# Patient Record
Sex: Male | Born: 1980
Health system: Southern US, Community
[De-identification: ages and names within clinical notes are randomized; demographics above are authoritative.]

## PROBLEM LIST (undated history)

## (undated) DIAGNOSIS — I10 Essential (primary) hypertension: Secondary | ICD-10-CM

## (undated) HISTORY — DX: Essential (primary) hypertension: I10

---

## 2008-12-09 DIAGNOSIS — J309 Allergic rhinitis, unspecified: Secondary | ICD-10-CM | POA: Insufficient documentation

## 2015-05-04 DIAGNOSIS — H903 Sensorineural hearing loss, bilateral: Secondary | ICD-10-CM | POA: Insufficient documentation

## 2016-09-14 ENCOUNTER — Ambulatory Visit (INDEPENDENT_AMBULATORY_CARE_PROVIDER_SITE_OTHER): Payer: BLUE CROSS/BLUE SHIELD | Admitting: Family Medicine

## 2016-09-14 ENCOUNTER — Encounter: Payer: Self-pay | Admitting: Family Medicine

## 2016-09-14 VITALS — BP 197/117 | HR 80 | Ht 70.0 in | Wt 194.0 lb

## 2016-09-14 DIAGNOSIS — I10 Essential (primary) hypertension: Secondary | ICD-10-CM | POA: Diagnosis not present

## 2016-09-14 DIAGNOSIS — Z789 Other specified health status: Secondary | ICD-10-CM | POA: Diagnosis not present

## 2016-09-14 DIAGNOSIS — Z720 Tobacco use: Secondary | ICD-10-CM | POA: Diagnosis not present

## 2016-09-14 DIAGNOSIS — Z7289 Other problems related to lifestyle: Secondary | ICD-10-CM

## 2016-09-14 DIAGNOSIS — Z114 Encounter for screening for human immunodeficiency virus [HIV]: Secondary | ICD-10-CM

## 2016-09-14 DIAGNOSIS — Z23 Encounter for immunization: Secondary | ICD-10-CM

## 2016-09-14 MED ORDER — LISINOPRIL 20 MG PO TABS: 20.0000 mg | ORAL_TABLET | Freq: Every day | ORAL | 1 refills | Status: DC

## 2016-09-14 NOTE — Progress Notes (Signed)
Lucas Rodriguez is a 35 y.o. male who presents [name] today for establish care and discuss hypertension oral tobacco and alcohol use.   1) hypertension: Patient notes a history of elevated blood pressure in the past. He's been on a diuretic in the past which caused significant urination and was obnoxious. He currently does not take any medications. No chest pain palpitations shortness of breath.  2) oral tobacco: Patient uses oral tobacco daily. He uses dip one can per day. He definitely feels the urge to use when he is not using. He is very much interested in quitting.  3) alcohol: Patient notes increasing alcohol use over the last several years. 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 However he is drinking almost every day now more than he thinks he should. He denies any alcohol withdrawal with quitting. He does not a family history for alcoholism.   Past Medical History:  Diagnosis Date  . Hypertension    History reviewed. No pertinent surgical history. Social History  Substance Use Topics  . Smoking status: Never Smoker  . Smokeless tobacco: Current User    Types: Snuff  . Alcohol use Yes   family history includes Alcohol abuse in his maternal grandfather, maternal grandmother, paternal grandfather, and paternal grandmother; Cancer in his maternal grandfather, maternal grandmother, paternal grandfather, and paternal grandmother; Hyperlipidemia in his father, maternal grandfather, mother, and paternal grandmother; Hypertension in his father, maternal grandfather, mother, and paternal grandmother.  ROS as above: No headache, visual changes, nausea, vomiting, diarrhea, constipation, dizziness, abdominal pain, skin rash, fevers, chills, night sweats, weight loss, swollen lymph nodes, body aches, joint swelling,  muscle aches, chest pain, shortness of breath, mood changes, visual or auditory hallucinations.    Medications: Current Outpatient Prescriptions  Medication Sig Dispense Refill  . lisinopril (PRINIVIL,ZESTRIL) 20 MG tablet Take 1 tablet (20 mg total) by mouth daily. 30 tablet 1   No current facility-administered medications for this visit.    Allergies  Allergen Reactions  . Penicillin G Rash    Also itching     Exam:  BP (!) 197/117   Pulse 80   Ht 5\' 10"  (1.778 m)   Wt 194 lb (88 kg)   BMI 27.84 kg/m  Gen: Well NAD HEENT: EOMI,  MMM Lungs: Normal work of breathing. CTABL Heart: RRR no MRG Abd: NABS, Soft. Nondistended, Nontender Exts: Brisk capillary refill, warm and well perfused.   Psych: Alert and oriented process and affect.  No results found for this or any previous visit (from the past 24 hour(s)). No results found.    Assessment and Plan: 35 y.o. male with   Hypertension: Not well controlled. Start lisinopril and check basic fasting labs  Oral tobacco: We spent a great deal of time talking about treatment options. Plan for nicotine replacement over-the-counter and recheck in 1 month.  Alcohol use: Verging on dependency or alcohol use disorder: Plan to work on cutting back or abstaining. Recheck in one month.   Orders Placed This Encounter  Procedures  . Tdap vaccine greater than or equal to 7yo IM  . Flu Vaccine QUAD 36+ mos PF IM (Fluarix & Fluzone Quad PF)  . CBC  . COMPLETE METABOLIC PANEL WITH GFR  . Hemoglobin A1c  . Lipid panel  . HIV antibody  . TSH  . VITAMIN D 25 Hydroxy (Vit-D Deficiency, Fractures)  Discussed warning signs or symptoms. Please see discharge instructions. Patient expresses understanding.

## 2016-09-14 NOTE — Patient Instructions (Signed)
Thank you for coming in today. 1) Blood pressure: Start Lisinopril daily.  Get fasting labs soon.  Recheck in 1 month.   2) Tobacco: Work on quitting. You can use a patch or gum or lozenges.   3) Alcohol: I recommend not drinking most nights.   We will recheck in 1 month.   Lisinopril tablets What is this medicine? LISINOPRIL (lyse IN oh pril) is an ACE inhibitor. This medicine is used to treat high blood pressure and heart failure. It is also used to protect the heart immediately after a heart attack. This medicine may be used for other purposes; ask your health care provider or pharmacist if you have questions. What should I tell my health care provider before I take this medicine? They need to know if you have any of these conditions: -diabetes -heart or blood vessel disease -kidney disease -low blood pressure -previous swelling of the tongue, face, or lips with difficulty breathing, difficulty swallowing, hoarseness, or tightening of the throat -an unusual or allergic reaction to lisinopril, other ACE inhibitors, insect venom, foods, dyes, or preservatives -pregnant or trying to get pregnant -breast-feeding How should I use this medicine? Take this medicine by mouth with a glass of water. Follow the directions on your prescription label. You may take this medicine with or without food. If it upsets your stomach, take it with food. Take your medicine at regular intervals. Do not take it more often than directed. Do not stop taking except on your doctor's advice. Talk to your pediatrician regarding the use of this medicine in children. Special care may be needed. While this drug may be prescribed for children as young as 256 years of age for selected conditions, precautions do apply. Overdosage: If you think you have taken too much of this medicine contact a poison control center or emergency room at once. NOTE: This medicine is only for you. Do not share this medicine with others. What  if I miss a dose? If you miss a dose, take it as soon as you can. If it is almost time for your next dose, take only that dose. Do not take double or extra doses. What may interact with this medicine? Do not take this medicine with any of the following medications: -hymenoptera venomThis medicines may also interact with the following medications: -aliskiren -angiotensin receptor blockers, like losartan or valsartan -certain medicines for diabetes -diuretics -everolimus -gold compounds -lithium -NSAIDs, medicines for pain and inflammation, like ibuprofen or naproxen -potassium salts or supplements -salt substitutes -sirolimus -temsirolimus This list may not describe all possible interactions. Give your health care provider a list of all the medicines, herbs, non-prescription drugs, or dietary supplements you use. Also tell them if you smoke, drink alcohol, or use illegal drugs. Some items may interact with your medicine. What should I watch for while using this medicine? Visit your doctor or health care professional for regular check ups. Check your blood pressure as directed. Ask your doctor what your blood pressure should be, and when you should contact him or her. Do not treat yourself for coughs, colds, or pain while you are using this medicine without asking your doctor or health care professional for advice. Some ingredients may increase your blood pressure. Women should inform their doctor if they wish to become pregnant or think they might be pregnant. There is a potential for serious side effects to an unborn child. Talk to your health care professional or pharmacist for more information. Check with your doctor or health  care professional if you get an attack of severe diarrhea, nausea and vomiting, or if you sweat a lot. The loss of too much body fluid can make it dangerous for you to take this medicine. You may get drowsy or dizzy. Do not drive, use machinery, or do anything that  needs mental alertness until you know how this drug affects you. Do not stand or sit up quickly, especially if you are an older patient. This reduces the risk of dizzy or fainting spells. Alcohol can make you more drowsy and dizzy. Avoid alcoholic drinks. Avoid salt substitutes unless you are told otherwise by your doctor or health care professional. What side effects may I notice from receiving this medicine? Side effects that you should report to your doctor or health care professional as soon as possible: -allergic reactions like skin rash, itching or hives, swelling of the hands, feet, face, lips, throat, or tongue -breathing problems -signs and symptoms of kidney injury like trouble passing urine or change in the amount of urine -signs and symptoms of increased potassium like muscle weakness; chest pain; or fast, irregular heartbeat -signs and symptoms of liver injury like dark yellow or brown urine; general ill feeling or flu-like symptoms; light-colored stools; loss of appetite; nausea; right upper belly pain; unusually weak or tired; yellowing of the eyes or skin -signs and symptoms of low blood pressure like dizziness; feeling faint or lightheaded, falls; unusually weak or tired -stomach pain with or without nausea and vomiting Side effects that usually do not require medical attention (report to your doctor or health care professional if they continue or are bothersome): -changes in taste -cough -dizziness -fever -headache -sensitivity to light This list may not describe all possible side effects. Call your doctor for medical advice about side effects. You may report side effects to FDA at 1-800-FDA-1088. Where should I keep my medicine? Keep out of the reach of children. Store at room temperature between 15 and 30 degrees C (59 and 86 degrees F). Protect from moisture. Keep container tightly closed. Throw away any unused medicine after the expiration date. NOTE: This sheet is a  summary. It may not cover all possible information. If you have questions about this medicine, talk to your doctor, pharmacist, or health care provider.    2016, Elsevier/Gold Standard. (2015-07-23 20:38:20)

## 2016-09-15 LAB — COMPLETE METABOLIC PANEL WITH GFR
ALBUMIN: 4.7 g/dL (ref 3.6–5.1)
ALK PHOS: 75 U/L (ref 40–115)
ALT: 54 U/L — ABNORMAL HIGH (ref 9–46)
AST: 40 U/L (ref 10–40)
BUN: 12 mg/dL (ref 7–25)
CALCIUM: 9.5 mg/dL (ref 8.6–10.3)
CO2: 25 mmol/L (ref 20–31)
Chloride: 101 mmol/L (ref 98–110)
Creat: 1.02 mg/dL (ref 0.60–1.35)
Glucose, Bld: 87 mg/dL (ref 65–99)
POTASSIUM: 4 mmol/L (ref 3.5–5.3)
Sodium: 139 mmol/L (ref 135–146)
Total Bilirubin: 1 mg/dL (ref 0.2–1.2)
Total Protein: 6.7 g/dL (ref 6.1–8.1)

## 2016-09-15 LAB — CBC
HEMATOCRIT: 48.6 % (ref 38.5–50.0)
HEMOGLOBIN: 17 g/dL (ref 13.2–17.1)
MCH: 32.8 pg (ref 27.0–33.0)
MCHC: 35 g/dL (ref 32.0–36.0)
MCV: 93.6 fL (ref 80.0–100.0)
MPV: 9 fL (ref 7.5–12.5)
Platelets: 225 10*3/uL (ref 140–400)
RBC: 5.19 MIL/uL (ref 4.20–5.80)
RDW: 12.8 % (ref 11.0–15.0)
WBC: 7.2 10*3/uL (ref 3.8–10.8)

## 2016-09-15 LAB — LIPID PANEL
CHOL/HDL RATIO: 4.5 ratio (ref <=5.0)
CHOLESTEROL: 265 mg/dL — AB (ref 125–200)
HDL: 59 mg/dL (ref >=40)
LDL Cholesterol: 145 mg/dL — ABNORMAL HIGH (ref <130)
TRIGLYCERIDES: 306 mg/dL — AB (ref <150)
VLDL: 61 mg/dL — ABNORMAL HIGH (ref <30)

## 2016-09-15 LAB — TSH: TSH: 1.29 m[IU]/L (ref 0.40–4.50)

## 2016-09-16 LAB — HEMOGLOBIN A1C
HEMOGLOBIN A1C: 5 % (ref <5.7)
Mean Plasma Glucose: 97 mg/dL

## 2016-09-16 LAB — VITAMIN D 25 HYDROXY (VIT D DEFICIENCY, FRACTURES): VIT D 25 HYDROXY: 23 ng/mL — AB (ref 30–100)

## 2016-09-16 LAB — HIV ANTIBODY (ROUTINE TESTING W REFLEX): HIV 1&2 Ab, 4th Generation: NONREACTIVE

## 2016-10-14 ENCOUNTER — Other Ambulatory Visit: Payer: Self-pay | Admitting: *Deleted

## 2016-10-14 DIAGNOSIS — I10 Essential (primary) hypertension: Secondary | ICD-10-CM

## 2016-10-14 DIAGNOSIS — Z720 Tobacco use: Secondary | ICD-10-CM

## 2016-10-14 DIAGNOSIS — Z7289 Other problems related to lifestyle: Secondary | ICD-10-CM

## 2016-10-14 DIAGNOSIS — Z789 Other specified health status: Secondary | ICD-10-CM

## 2016-10-14 NOTE — Progress Notes (Signed)
Request for 90 day supply for lisinopril 20. We can refill  For 90 after patoen has f/u appt. Should have meds until then. rx was originally written 10/2 for #30 with one refill

## 2016-10-17 ENCOUNTER — Ambulatory Visit: Payer: BLUE CROSS/BLUE SHIELD | Admitting: Family Medicine

## 2016-10-28 ENCOUNTER — Encounter: Payer: Self-pay | Admitting: Family Medicine

## 2016-10-28 ENCOUNTER — Ambulatory Visit (INDEPENDENT_AMBULATORY_CARE_PROVIDER_SITE_OTHER): Payer: BLUE CROSS/BLUE SHIELD | Admitting: Family Medicine

## 2016-10-28 VITALS — BP 142/92 | HR 84 | Wt 197.0 lb

## 2016-10-28 DIAGNOSIS — I10 Essential (primary) hypertension: Secondary | ICD-10-CM

## 2016-10-28 DIAGNOSIS — Z720 Tobacco use: Secondary | ICD-10-CM

## 2016-10-28 DIAGNOSIS — Z789 Other specified health status: Secondary | ICD-10-CM | POA: Diagnosis not present

## 2016-10-28 DIAGNOSIS — Z7289 Other problems related to lifestyle: Secondary | ICD-10-CM

## 2016-10-28 MED ORDER — LISINOPRIL-HYDROCHLOROTHIAZIDE 20-25 MG PO TABS: 1.0000 | ORAL_TABLET | Freq: Every day | ORAL | 0 refills | Status: DC

## 2016-10-28 NOTE — Progress Notes (Signed)
       Sunnie NielsenDrew Hegna is a 35 y.o. male who [name] today for follow-up blood pressure tobacco and alcohol.  Hypertension: Doing well with lisinopril 20 mg daily. No chest pains palpitations shortness of breath lightheadedness or dizziness.  Oral tobacco: Decreased use from 7 tins of dip weekly to 1.5 tins of dip weekly.   Alcohol: Patient is reduced his drinking to just on the weekends. He feels well.   Past Medical History:  Diagnosis Date  . Hypertension    No past surgical history on file. Social History  Substance Use Topics  . Smoking status: Never Smoker  . Smokeless tobacco: Current User    Types: Snuff  . Alcohol use Yes   family history includes Alcohol abuse in his maternal grandfather, maternal grandmother, paternal grandfather, and paternal grandmother; Cancer in his maternal grandfather, maternal grandmother, paternal grandfather, and paternal grandmother; Hyperlipidemia in his father, maternal grandfather, mother, and paternal grandmother; Hypertension in his father, maternal grandfather, mother, and paternal grandmother.  ROS as above:  Medications: Current Outpatient Prescriptions  Medication Sig Dispense Refill  . lisinopril-hydrochlorothiazide (PRINZIDE,ZESTORETIC) 20-25 MG tablet Take 1 tablet by mouth daily. 90 tablet 0   No current facility-administered medications for this visit.    Allergies  Allergen Reactions  . Penicillin G Rash    Also itching    Health Maintenance Health Maintenance  Topic Date Due  . TETANUS/TDAP  09/14/2026  . INFLUENZA VACCINE  Completed  . HIV Screening  Completed     Exam:  BP (!) 142/92   Pulse 84   Wt 197 lb (89.4 kg)   BMI 28.27 kg/m  Gen: Well NAD HEENT: EOMI,  MMM Lungs: Normal work of breathing. CTABL Heart: RRR no MRG Abd: NABS, Soft. Nondistended, Nontender Exts: Brisk  capillary refill, warm and well perfused.    No results found for this or any previous visit (from the past 72 hour(s)). No results found.    Assessment and Plan: 35 y.o. male with  Hypertension: Doing well. Increase to lisinopril 20 mg/hydrochlorothiazide 25 mg daily. Recheck in 1 month. Check labs at that time.  Tobacco and alcohol successfully decreased. Recheck in the near future.   Orders Placed This Encounter  Procedures  . COMPLETE METABOLIC PANEL WITH GFR    Discussed warning signs or symptoms. Please see discharge instructions. Patient expresses understanding.

## 2016-10-28 NOTE — Patient Instructions (Signed)
Thank you for coming in today. STOP lisinopril START lisinopril/HCTZ.  Return for recheck in about 1 month.  Get lab a few days before your recheck.  Call or go to the emergency room if you get worse, have trouble breathing, have chest pains, or palpitations.   Hydrochlorothiazide, HCTZ; Lisinopril tablets What is this medicine? HYDROCHLOROTHIAZIDE; LISINOPRIL (hye droe klor oh THYE a zide; lyse IN oh pril) is a combination of a diuretic and an ACE inhibitor. It is used to treat high blood pressure. This medicine may be used for other purposes; ask your health care provider or pharmacist if you have questions. COMMON BRAND NAME(S): Prinzide, Zestoretic What should I tell my health care provider before I take this medicine? They need to know if you have any of these conditions: -bone marrow disease -decreased urine -heart or blood vessel disease -if you are on a special diet like a low salt diet -immune system problems, like lupus -kidney disease -liver disease -previous swelling of the tongue, face, or lips with difficulty breathing, difficulty swallowing, hoarseness, or tightening of the throat -recent heart attack or stroke -an unusual or allergic reaction to lisinopril, hydrochlorothiazide, sulfa drugs, other medicines, insect venom, foods, dyes, or preservatives -pregnant or trying to get pregnant -breast-feeding How should I use this medicine? Take this medicine by mouth with a glass of water. Follow the directions on the prescription label. You can take it with or without food. If it upsets your stomach, take it with food. Take your medicine at regular intervals. Do not take it more often than directed. Do not stop taking except on your doctor's advice. Talk to your pediatrician regarding the use of this medicine in children. Special care may be needed. Overdosage: If you think you have taken too much of this medicine contact a poison control center or emergency room at  once. NOTE: This medicine is only for you. Do not share this medicine with others. What if I miss a dose? If you miss a dose, take it as soon as you can. If it is almost time for your next dose, take only that dose. Do not take double or extra doses. What may interact with this medicine? Do not take this medication with any of the following medications: -sacubitril; valsartan This medicine may also interact with the following: -barbiturates like phenobarbital -blood pressure medicines -corticosteroids like prednisone -diabetic medications -diuretics, especially triamterene, spironolactone or amiloride -lithium -NSAIDs, medicines for pain and inflammation, like ibuprofen or naproxen -potassium salts or potassium supplements -prescription pain medicines -skeletal muscle relaxants like tubocurarine -some cholesterol lowering medications like cholestyramine or colestipol This list may not describe all possible interactions. Give your health care provider a list of all the medicines, herbs, non-prescription drugs, or dietary supplements you use. Also tell them if you smoke, drink alcohol, or use illegal drugs. Some items may interact with your medicine. What should I watch for while using this medicine? Visit your doctor or health care professional for regular checks on your progress. Check your blood pressure as directed. Ask your doctor or health care professional what your blood pressure should be and when you should contact him or her. Call your doctor or health care professional if you notice an irregular or fast heart beat. You must not get dehydrated. Ask your doctor or health care professional how much fluid you need to drink a day. Check with him or her if you get an attack of severe diarrhea, nausea and vomiting, or if you sweat a  lot. The loss of too much body fluid can make it dangerous for you to take this medicine. Women should inform their doctor if they wish to become pregnant or  think they might be pregnant. There is a potential for serious side effects to an unborn child. Talk to your health care professional or pharmacist for more information. You may get drowsy or dizzy. Do not drive, use machinery, or do anything that needs mental alertness until you know how this drug affects you. Do not stand or sit up quickly, especially if you are an older patient. This reduces the risk of dizzy or fainting spells. Alcohol can make you more drowsy and dizzy. Avoid alcoholic drinks. This medicine may affect your blood sugar level. If you have diabetes, check with your doctor or health care professional before changing the dose of your diabetic medicine. Avoid salt substitutes unless you are told otherwise by your doctor or health care professional. This medicine can make you more sensitive to the sun. Keep out of the sun. If you cannot avoid being in the sun, wear protective clothing and use sunscreen. Do not use sun lamps or tanning beds/booths. Do not treat yourself for coughs, colds, or pain while you are taking this medicine without asking your doctor or health care professional for advice. Some ingredients may increase your blood pressure. What side effects may I notice from receiving this medicine? Side effects that you should report to your doctor or health care professional as soon as possible: -changes in vision -confusion, dizziness, light headedness or fainting spells -decreased amount of urine passed -difficulty breathing or swallowing, hoarseness, or tightening of the throat -eye pain -fast or irregular heart beat, palpitations, or chest pain -muscle cramps -nausea and vomiting -persistent dry cough -redness, blistering, peeling or loosening of the skin, including inside the mouth -stomach pain -swelling of your face, lips, tongue, hands, or feet -unusual rash, bleeding or bruising, or pinpoint red spots on the skin -worsened gout pain -yellowing of the eyes or  skin Side effects that usually do not require medical attention (report to your doctor or health care professional if they continue or are bothersome): -change in sex drive or performance -cough -headache This list may not describe all possible side effects. Call your doctor for medical advice about side effects. You may report side effects to FDA at 1-800-FDA-1088. Where should I keep my medicine? Keep out of the reach of children. Store at room temperature between 20 and 25 degrees C (68 and 77 degrees F). Protect from moisture and excessive light. Keep container tightly closed. Throw away any unused medicine after the expiration date. NOTE: This sheet is a summary. It may not cover all possible information. If you have questions about this medicine, talk to your doctor, pharmacist, or health care provider.  2017 Elsevier/Gold Standard (2016-01-22 11:42:20)

## 2016-11-19 LAB — COMPLETE METABOLIC PANEL WITH GFR
ALBUMIN: 4.7 g/dL (ref 3.6–5.1)
ALK PHOS: 63 U/L (ref 40–115)
ALT: 69 U/L — ABNORMAL HIGH (ref 9–46)
AST: 40 U/L (ref 10–40)
BUN: 10 mg/dL (ref 7–25)
CHLORIDE: 101 mmol/L (ref 98–110)
CO2: 25 mmol/L (ref 20–31)
Calcium: 9.5 mg/dL (ref 8.6–10.3)
Creat: 1.1 mg/dL (ref 0.60–1.35)
GFR, EST NON AFRICAN AMERICAN: 88 mL/min (ref >=60)
GFR, Est African American: >89 mL/min (ref >=60)
GLUCOSE: 108 mg/dL — AB (ref 65–99)
POTASSIUM: 3.6 mmol/L (ref 3.5–5.3)
SODIUM: 140 mmol/L (ref 135–146)
Total Bilirubin: 0.7 mg/dL (ref 0.2–1.2)
Total Protein: 6.9 g/dL (ref 6.1–8.1)

## 2016-11-25 ENCOUNTER — Ambulatory Visit (INDEPENDENT_AMBULATORY_CARE_PROVIDER_SITE_OTHER): Payer: BLUE CROSS/BLUE SHIELD | Admitting: Family Medicine

## 2016-11-25 VITALS — BP 131/77 | HR 93 | Wt 199.0 lb

## 2016-11-25 DIAGNOSIS — Z23 Encounter for immunization: Secondary | ICD-10-CM | POA: Diagnosis not present

## 2016-11-25 DIAGNOSIS — Z7184 Encounter for health counseling related to travel: Secondary | ICD-10-CM

## 2016-11-25 DIAGNOSIS — Z789 Other specified health status: Secondary | ICD-10-CM | POA: Diagnosis not present

## 2016-11-25 DIAGNOSIS — Z7189 Other specified counseling: Secondary | ICD-10-CM

## 2016-11-25 DIAGNOSIS — Z720 Tobacco use: Secondary | ICD-10-CM

## 2016-11-25 DIAGNOSIS — I10 Essential (primary) hypertension: Secondary | ICD-10-CM | POA: Diagnosis not present

## 2016-11-25 DIAGNOSIS — Z7289 Other problems related to lifestyle: Secondary | ICD-10-CM

## 2016-11-25 MED ORDER — LISINOPRIL-HYDROCHLOROTHIAZIDE 20-25 MG PO TABS: 1.0000 | ORAL_TABLET | Freq: Every day | ORAL | 0 refills | Status: DC

## 2016-11-25 MED ORDER — CIPROFLOXACIN HCL 500 MG PO TABS: 500.0000 mg | ORAL_TABLET | Freq: Two times a day (BID) | ORAL | 0 refills | Status: AC

## 2016-11-25 MED ORDER — AMLODIPINE BESYLATE 10 MG PO TABS: 10.0000 mg | ORAL_TABLET | Freq: Every day | ORAL | 0 refills | Status: DC

## 2016-11-25 NOTE — Patient Instructions (Addendum)
Thank you for coming in today. Consider the [facility] [place] for alcohol abuse.  Also consider AA.  Take Norvasc daily for blood pressure.   Recheck in 2-3 months.

## 2016-11-25 NOTE — Progress Notes (Signed)
Pt given 1st hep A vaccination today. He will need to RTC in 6 mos for 2nd vaccination 05/26/2017.Loralee PacasBarkley, Markeem Noreen KraemerLynetta

## 2016-11-25 NOTE — Progress Notes (Addendum)
       Lucas NielsenDrew Rodriguez is a 35 y.o. male who [name] today for follow-up hypertension, oral tobacco, alcohol use. Additionally patient wishes to discuss upcoming trip to [place].  Hypertension: Subjectively doing well with lisinopril/hydrochlorothiazide. No chest pain palpitations shortness of breath. Patient notes home blood pressures are typically in the 140s range.  Alcohol use: Patient notes he has been unable to cut back on alcohol. He continues to drink heavily at night especially when traveling. He perceives this as a problem and has had difficulty cutting back or quitting previously. He feels poorly about this and wonders if he may be an alcoholic.  Oral tobacco use: Patient has cut back on his oral tobacco use and feels well.  Patient is traveling to [place] in January and 50 mL never had a hepatitis A vaccine. He will be going to [place] city.    Past Medical History:  Diagnosis Date  . Hypertension    No past surgical history on file. Social History  Substance Use Topics  . Smoking status: Never Smoker  . Smokeless tobacco: Current User    Types: Snuff  . Alcohol use Yes   family history includes Alcohol abuse in his maternal grandfather, maternal grandmother, paternal grandfather, and paternal grandmother; Cancer in his maternal grandfather, maternal grandmother, paternal grandfather, and paternal grandmother; Hyperlipidemia in his father, maternal grandfather, mother, and paternal grandmother; Hypertension in his father, maternal grandfather, mother, and paternal grandmother.  ROS as above:  Medications: Current Outpatient Prescriptions  Medication Sig Dispense Refill  . lisinopril-hydrochlorothiazide (PRINZIDE,ZESTORETIC) 20-25 MG tablet Take 1 tablet by mouth daily. 90 tablet 0  . amLODipine (NORVASC) 10 MG tablet Take 1 tablet (10 mg total) by  mouth daily. 90 tablet 0   No current facility-administered medications for this visit.    Allergies  Allergen Reactions  . Penicillin G Rash    Also itching    Health Maintenance Health Maintenance  Topic Date Due  . TETANUS/TDAP  09/14/2026  . INFLUENZA VACCINE  Completed  . HIV Screening  Completed     Exam:  BP 131/77 (BP Location: Left Arm, Cuff Size: Large)   Pulse 93   Wt 199 lb (90.3 kg)   SpO2 100%   BMI 28.55 kg/m  Gen: Well NAD HEENT: EOMI,  MMM Lungs: Normal work of breathing. CTABL Heart: RRR no MRG Abd: NABS, Soft. Nondistended, Nontender Exts: Brisk capillary refill, warm and well perfused.    No results found for this or any previous visit (from the past 72 hour(s)). No results found.    Assessment and Plan: 35 y.o. male with  Hypertension: Blood pressure continues to be somewhat elevated especially at home. Add amlodipine and recheck in a few months.  Alcohol abuse: Continued. Discussed options. Patient will think about Alcoholics Anonymous at this time.  Oral tobacco use: Continue to cut back. Travel encounter: Hepatitis A vaccine given. Administer second dose in 6 months.   Orders Placed This Encounter  Procedures  . Hepatitis A vaccine adult IM    Discussed warning signs or symptoms. Please see discharge instructions. Patient expresses understanding.

## 2017-02-03 ENCOUNTER — Ambulatory Visit (INDEPENDENT_AMBULATORY_CARE_PROVIDER_SITE_OTHER): Payer: BLUE CROSS/BLUE SHIELD | Admitting: Family Medicine

## 2017-02-03 ENCOUNTER — Encounter: Payer: Self-pay | Admitting: Family Medicine

## 2017-02-03 VITALS — BP 130/75 | HR 78 | Wt 192.0 lb

## 2017-02-03 DIAGNOSIS — I10 Essential (primary) hypertension: Secondary | ICD-10-CM | POA: Diagnosis not present

## 2017-02-03 DIAGNOSIS — Z789 Other specified health status: Secondary | ICD-10-CM | POA: Diagnosis not present

## 2017-02-03 DIAGNOSIS — Z7289 Other problems related to lifestyle: Secondary | ICD-10-CM

## 2017-02-03 MED ORDER — LISINOPRIL-HYDROCHLOROTHIAZIDE 20-25 MG PO TABS: 1.0000 | ORAL_TABLET | Freq: Every day | ORAL | 3 refills | Status: DC

## 2017-02-03 MED ORDER — AMLODIPINE BESYLATE 10 MG PO TABS: 10.0000 mg | ORAL_TABLET | Freq: Every day | ORAL | 3 refills | Status: DC

## 2017-02-03 NOTE — Progress Notes (Signed)
       Lucas Rodriguez is a 36 y.o. male who [facility] today for follow-up hypertension alcohol use and oral tobacco use.  Hypertension: Patient takes amlodipine and hydrochlorothiazide/lisinopril as below. He feels well with no chest pain palpitations or shortness of breath. He takes his blood pressure at home and notes that usually his systolic pressures in the 120s and diastolic is in the 70s. He denies episodes of hypotension and lightheadedness or dizziness. He feels well.  We'll tobacco use: Patient has quit oral tobacco and feels well.  Alcohol abuse: Patient has a history of developing alcohol abuse. He notes he has significantly cut back on drinking and feels great. He generally does not drink alcohol at all anymore.   Past Medical History:  Diagnosis Date  . Hypertension    No past surgical history on file. Social History  Substance Use Topics  . Smoking status: Never Smoker  . Smokeless tobacco: Former NeurosurgeonUser    Types: Snuff  . Alcohol use No     Comment: Prior developing problem. Now abstinent   family history includes Alcohol abuse in his maternal grandfather, maternal grandmother, paternal grandfather, and paternal grandmother; Cancer in his maternal grandfather, maternal grandmother, paternal grandfather, and paternal grandmother; Hyperlipidemia in his father, maternal grandfather, mother, and paternal grandmother; Hypertension in his father, maternal grandfather, mother, and paternal grandmother.  ROS as above:  Medications: Current Outpatient Prescriptions  Medication Sig Dispense Refill  . amLODipine (NORVASC) 10 MG tablet Take 1 tablet (10 mg total) by mouth daily. 90 tablet 3  . lisinopril-hydrochlorothiazide (PRINZIDE,ZESTORETIC) 20-25 MG tablet Take 1 tablet by mouth daily. 90 tablet 3   No current facility-administered medications for  this visit.    Allergies  Allergen Reactions  . Penicillin G Rash    Also itching    Health Maintenance Health Maintenance  Topic Date Due  . TETANUS/TDAP  09/14/2026  . INFLUENZA VACCINE  Completed  . HIV Screening  Completed     Exam:  BP 130/75   Pulse 78   Wt 192 lb (87.1 kg)   BMI 27.55 kg/m  Gen: Well NAD HEENT: EOMI,  MMM Lungs: Normal work of breathing. CTABL Heart: RRR no MRG Abd: NABS, Soft. Nondistended, Nontender Exts: Brisk capillary refill, warm and well perfused.    No results found for this or any previous visit (from the past 72 hour(s)). No results found.    Assessment and Plan: 36 y.o. male with  Hypertension: Doing well. Continue current regimen. Recheck in 6-12 months.  Oral tobacco use: Patient has quit oral tobacco.  Alcohol use: Patient is largely abstaining from alcohol and seems to have a more healthy relationship with a. Plan for watchful waiting.   No orders of the defined types were placed in this encounter.  Meds ordered this encounter  Medications  . amLODipine (NORVASC) 10 MG tablet    Sig: Take 1 tablet (10 mg total) by mouth daily.    Dispense:  90 tablet    Refill:  3  . lisinopril-hydrochlorothiazide (PRINZIDE,ZESTORETIC) 20-25 MG tablet    Sig: Take 1 tablet by mouth daily.    Dispense:  90 tablet    Refill:  3     Discussed warning signs or symptoms. Please see discharge instructions. Patient expresses understanding.

## 2017-02-03 NOTE — Patient Instructions (Signed)
Thank you for coming in today. Continue current medicines.  Recheck in 6-12 months or sooner if needed.   Continue to be aware about alcohol use.

## 2017-10-09 DIAGNOSIS — H903 Sensorineural hearing loss, bilateral: Secondary | ICD-10-CM | POA: Diagnosis not present

## 2017-10-23 DIAGNOSIS — H903 Sensorineural hearing loss, bilateral: Secondary | ICD-10-CM | POA: Diagnosis not present

## 2017-12-22 DIAGNOSIS — H903 Sensorineural hearing loss, bilateral: Secondary | ICD-10-CM | POA: Diagnosis not present

## 2018-03-14 ENCOUNTER — Other Ambulatory Visit: Payer: Self-pay | Admitting: Family Medicine

## 2018-03-16 ENCOUNTER — Telehealth: Payer: Self-pay

## 2018-03-16 NOTE — Telephone Encounter (Signed)
Patient advised that appointment is needed for further refills. Pamalee Marcoe,CMA   

## 2018-05-04 ENCOUNTER — Other Ambulatory Visit: Payer: Self-pay | Admitting: Family Medicine

## 2018-07-02 ENCOUNTER — Other Ambulatory Visit: Payer: Self-pay | Admitting: Family Medicine

## 2018-07-03 ENCOUNTER — Other Ambulatory Visit: Payer: Self-pay | Admitting: Family Medicine

## 2018-07-03 MED ORDER — AMLODIPINE BESYLATE 10 MG PO TABS: 10.0000 mg | ORAL_TABLET | Freq: Every day | ORAL | 0 refills | Status: DC

## 2018-07-03 MED ORDER — LISINOPRIL-HYDROCHLOROTHIAZIDE 20-25 MG PO TABS: 1.0000 | ORAL_TABLET | Freq: Every day | ORAL | 0 refills | Status: DC

## 2018-08-02 ENCOUNTER — Other Ambulatory Visit: Payer: Self-pay | Admitting: Family Medicine

## 2019-02-22 ENCOUNTER — Other Ambulatory Visit: Payer: Self-pay | Admitting: Family Medicine

## 2019-02-27 MED ORDER — AMLODIPINE BESYLATE 10 MG PO TABS: 10.0000 mg | ORAL_TABLET | Freq: Every day | ORAL | 0 refills | Status: DC

## 2019-02-27 MED ORDER — LISINOPRIL-HYDROCHLOROTHIAZIDE 20-25 MG PO TABS: 1.0000 | ORAL_TABLET | Freq: Every day | ORAL | 0 refills | Status: DC

## 2019-03-11 ENCOUNTER — Other Ambulatory Visit: Payer: Self-pay

## 2019-03-11 ENCOUNTER — Encounter: Payer: Self-pay | Admitting: Family Medicine

## 2019-03-11 ENCOUNTER — Ambulatory Visit: Payer: BLUE CROSS/BLUE SHIELD | Admitting: Family Medicine

## 2019-03-11 VITALS — BP 155/94 | HR 84 | Temp 98.2°F | Wt 187.0 lb

## 2019-03-11 DIAGNOSIS — Z789 Other specified health status: Secondary | ICD-10-CM

## 2019-03-11 DIAGNOSIS — Z7289 Other problems related to lifestyle: Secondary | ICD-10-CM

## 2019-03-11 DIAGNOSIS — I10 Essential (primary) hypertension: Secondary | ICD-10-CM | POA: Diagnosis not present

## 2019-03-11 LAB — COMPLETE METABOLIC PANEL WITH GFR
AG Ratio: 2.4 (calc) (ref 1.0–2.5)
ALKALINE PHOSPHATASE (APISO): 76 U/L (ref 36–130)
ALT: 67 U/L — AB (ref 9–46)
AST: 47 U/L — ABNORMAL HIGH (ref 10–40)
Albumin: 5 g/dL (ref 3.6–5.1)
BUN: 13 mg/dL (ref 7–25)
CALCIUM: 9.8 mg/dL (ref 8.6–10.3)
CO2: 26 mmol/L (ref 20–32)
CREATININE: 0.96 mg/dL (ref 0.60–1.35)
Chloride: 98 mmol/L (ref 98–110)
GFR, Est African American: 118 mL/min/{1.73_m2} (ref >=60)
GFR, Est Non African American: 102 mL/min/{1.73_m2} (ref >=60)
GLOBULIN: 2.1 g/dL (ref 1.9–3.7)
GLUCOSE: 93 mg/dL (ref 65–99)
Potassium: 3.9 mmol/L (ref 3.5–5.3)
Sodium: 138 mmol/L (ref 135–146)
Total Bilirubin: 0.5 mg/dL (ref 0.2–1.2)
Total Protein: 7.1 g/dL (ref 6.1–8.1)

## 2019-03-11 LAB — CBC
HEMATOCRIT: 47.6 % (ref 38.5–50.0)
Hemoglobin: 17.1 g/dL (ref 13.2–17.1)
MCH: 32.9 pg (ref 27.0–33.0)
MCHC: 35.9 g/dL (ref 32.0–36.0)
MCV: 91.5 fL (ref 80.0–100.0)
MPV: 9.3 fL (ref 7.5–12.5)
Platelets: 266 10*3/uL (ref 140–400)
RBC: 5.2 10*6/uL (ref 4.20–5.80)
RDW: 13.2 % (ref 11.0–15.0)
WBC: 6.5 10*3/uL (ref 3.8–10.8)

## 2019-03-11 LAB — LIPID PANEL W/REFLEX DIRECT LDL
CHOL/HDL RATIO: 3.6 (calc) (ref <5.0)
CHOLESTEROL: 279 mg/dL — AB (ref <200)
HDL: 77 mg/dL (ref >=40)
LDL CHOLESTEROL (CALC): 174 mg/dL — AB
NON-HDL CHOLESTEROL (CALC): 202 mg/dL — AB (ref <130)
TRIGLYCERIDES: 138 mg/dL (ref <150)

## 2019-03-11 MED ORDER — AMLODIPINE BESYLATE 10 MG PO TABS: 10.0000 mg | ORAL_TABLET | Freq: Every day | ORAL | 3 refills | Status: DC

## 2019-03-11 MED ORDER — LISINOPRIL-HYDROCHLOROTHIAZIDE 20-25 MG PO TABS: 1.0000 | ORAL_TABLET | Freq: Every day | ORAL | 3 refills | Status: DC

## 2019-03-11 NOTE — Progress Notes (Signed)
Lucas Rodriguez is a 38 y.o. male who presents to [name] today for follow-up hypertension.  Shonte takes medications listed below.  He notes his blood pressures at home are typically in the 130s over 80s.  He denies chest pain palpitations shortness of breath.  He mains fit healthy and active.  He is happy with how things are going.  He is currently fasting.  He notes that he is also cut back on his alcohol use.  He drinks mostly occasional weekends and typically 2 drinks or so on the weekends.  He does not drink heavily.   ROS as above:  Exam:  BP (!) 155/94   Pulse 84   Temp 98.2 F (36.8 C) (Oral)   Wt 187 lb (84.8 kg)   BMI 26.83 kg/m  Wt Readings from Last 5 Encounters:  03/11/19 187 lb (84.8 kg)  02/03/17 192 lb (87.1 kg)  11/25/16 199 lb (90.3 kg)  10/28/16 197 lb (89.4 kg)  09/14/16 194 lb (88 kg)    Gen: Well NAD HEENT: EOMI,  MMM Lungs: Normal work of breathing. CTABL Heart: RRR no MRG Abd: NABS, Soft. Nondistended, Nontender Exts: Brisk capillary refill, warm and well perfused.   Lab and Radiology Results No results found for this or any previous visit (from the past 72 hour(s)). No results found.    Assessment and Plan: 38 y.o. male with  Hypertension: Reasonably controlled at home.  Blood pressure bit elevated today.  Plan to continue home blood pressure monitoring continue current medications and obtain basic labs listed below.  Alcohol use: Doing reasonably well.  Patient has cut back.  Watchful waiting.  Recheck yearly.  PDMP not reviewed this encounter. Orders Placed This Encounter  Procedures  . CBC  . COMPLETE METABOLIC PANEL WITH GFR  . Lipid Panel w/reflex Direct LDL   Meds ordered this encounter  Medications  . amLODipine (NORVASC) 10 MG tablet    Sig: Take 1 tablet (10 mg total) by mouth daily.    Dispense:  90  tablet    Refill:  3  . lisinopril-hydrochlorothiazide (PRINZIDE,ZESTORETIC) 20-25 MG tablet    Sig: Take 1 tablet by mouth daily.    Dispense:  90 tablet    Refill:  3     Historical information moved to improve visibility of documentation.  Past Medical History:  Diagnosis Date  . Hypertension    No past surgical history on file. Social History   Tobacco Use  . Smoking status: Never Smoker  . Smokeless tobacco: Former Neurosurgeon    Types: Snuff  Substance Use Topics  . Alcohol use: No    Comment: Prior developing problem. Now abstinent   family history includes Alcohol abuse in his maternal grandfather, maternal grandmother, paternal grandfather, and paternal grandmother; Cancer in his maternal grandfather, maternal grandmother, paternal grandfather, and paternal grandmother; Hyperlipidemia in his father, maternal grandfather, mother, and paternal grandmother; Hypertension in his father, maternal grandfather, mother, and paternal grandmother.  Medications: Current Outpatient Medications  Medication Sig Dispense Refill  . amLODipine (NORVASC) 10 MG tablet Take 1 tablet (10 mg total) by mouth daily. 90 tablet 3  . lisinopril-hydrochlorothiazide (PRINZIDE,ZESTORETIC) 20-25 MG tablet Take 1 tablet by mouth daily. 90 tablet 3   No current facility-administered medications for this visit.    Allergies  Allergen Reactions  . Penicillin G Rash    Also itching     Discussed warning signs or symptoms. Please  see discharge instructions. Patient expresses understanding.

## 2019-03-11 NOTE — Patient Instructions (Signed)
Thank you for coming in today. Recheck in 1 year or sooner if needed.  Stay safe.  Let me know if you have any issues.

## 2019-03-12 MED ORDER — ATORVASTATIN CALCIUM 40 MG PO TABS: 40.0000 mg | ORAL_TABLET | Freq: Every day | ORAL | 1 refills | Status: DC

## 2019-03-12 NOTE — Addendum Note (Signed)
Addended by: Rodolph Bong on: 03/12/2019 07:28 AM   Modules accepted: Orders

## 2019-06-11 ENCOUNTER — Telehealth: Payer: Self-pay | Admitting: Family Medicine

## 2019-06-11 DIAGNOSIS — E782 Mixed hyperlipidemia: Secondary | ICD-10-CM

## 2019-06-11 DIAGNOSIS — Z5181 Encounter for therapeutic drug level monitoring: Secondary | ICD-10-CM

## 2019-06-11 DIAGNOSIS — I10 Essential (primary) hypertension: Secondary | ICD-10-CM

## 2019-06-11 NOTE — Telephone Encounter (Signed)
Called and left a message for patient to go to lab to have fasting labs drawn.

## 2019-06-11 NOTE — Telephone Encounter (Signed)
We started Lipitor a few months ago and you are due for recheck cholesterol and liver function.  Labs should be done fasting.  I have ordered a lab now and you can swing by the lab and get them done fasting in the near future.

## 2019-06-11 NOTE — Telephone Encounter (Signed)
-----   Message from Gregor Hams, MD sent at 03/12/2019  7:28 AM EDT ----- Regarding: Check cholesterol 3 months after starting Lipitor Check cholesterol and CMP 3 months after starting Lipitor

## 2019-07-04 NOTE — Telephone Encounter (Signed)
Called and left pt msg asking him to go to lab to have fasting blood work done for medication management

## 2019-07-04 NOTE — Telephone Encounter (Signed)
Please attempt to call patient again.  He was notified a most 3 weeks ago and still has not gone for lab work.

## 2019-10-11 ENCOUNTER — Other Ambulatory Visit: Payer: Self-pay | Admitting: Family Medicine

## 2020-03-27 ENCOUNTER — Other Ambulatory Visit: Payer: Self-pay | Admitting: Family Medicine

## 2020-03-31 NOTE — Telephone Encounter (Signed)
Pt needs to establish with a new PCP

## 2020-04-02 NOTE — Telephone Encounter (Signed)
LVM to est with new PCP for refills  

## 2020-05-04 ENCOUNTER — Other Ambulatory Visit: Payer: Self-pay | Admitting: Osteopathic Medicine

## 2020-05-13 NOTE — Telephone Encounter (Signed)
Appt made for [facility] for 6/11

## 2020-05-22 ENCOUNTER — Ambulatory Visit (INDEPENDENT_AMBULATORY_CARE_PROVIDER_SITE_OTHER): Payer: BC Managed Care – PPO | Admitting: Family Medicine

## 2020-05-22 ENCOUNTER — Encounter: Payer: Self-pay | Admitting: Family Medicine

## 2020-05-22 VITALS — BP 130/86 | HR 88 | Temp 98.7°F | Ht 70.08 in | Wt 205.5 lb

## 2020-05-22 DIAGNOSIS — Z7289 Other problems related to lifestyle: Secondary | ICD-10-CM

## 2020-05-22 DIAGNOSIS — I10 Essential (primary) hypertension: Secondary | ICD-10-CM

## 2020-05-22 DIAGNOSIS — E785 Hyperlipidemia, unspecified: Secondary | ICD-10-CM

## 2020-05-22 DIAGNOSIS — Z789 Other specified health status: Secondary | ICD-10-CM

## 2020-05-22 MED ORDER — LISINOPRIL-HYDROCHLOROTHIAZIDE 20-25 MG PO TABS: 1.0000 | ORAL_TABLET | Freq: Every day | ORAL | 3 refills | Status: DC

## 2020-05-22 MED ORDER — AMLODIPINE BESYLATE 10 MG PO TABS: 10.0000 mg | ORAL_TABLET | Freq: Every day | ORAL | 3 refills | Status: DC

## 2020-05-22 NOTE — Progress Notes (Signed)
Pt requested COVID vaccine.  Advised patient where he could receive the shot.

## 2020-05-22 NOTE — Patient Instructions (Signed)
Great to meet you today! Continue current medications.  Have labs completed.

## 2020-05-24 ENCOUNTER — Encounter: Payer: Self-pay | Admitting: Family Medicine

## 2020-05-24 DIAGNOSIS — E785 Hyperlipidemia, unspecified: Secondary | ICD-10-CM | POA: Insufficient documentation

## 2020-05-24 NOTE — Assessment & Plan Note (Signed)
He has reduced EtOH intake significantly, encouraged to keep intake low.

## 2020-05-24 NOTE — Assessment & Plan Note (Signed)
Update lipid panel.  

## 2020-05-24 NOTE — Assessment & Plan Note (Signed)
Blood pressure is at goal at for age and co-morbidities.  I recommend continuation of current medications.  In addition they were instructed to follow a low sodium diet with regular exercise to help to maintain adequate control of blood pressure.  ? ?

## 2020-05-24 NOTE — Progress Notes (Signed)
Lucas Rodriguez - 39 y.o. male MRN [id number]  Date of birth: 22-Nov-1981  Subjective No chief complaint on file.   HPI Lucas Rodriguez is a 39 y.o. male with history of HTN and HLD here today for follow up visit.  HTN is currently managed with amlodipine 10mg  and lisinopril/hctz 20/25mg .  He is doing well with current medications.  He denies chest pain, shortness of breath, palpitations, headache or vision changes.   He has prior history of heavy EtOH use but has reduced this intake significantly which has helped him improve his weight and blood pressure.   ROS:  A comprehensive ROS was completed and negative except as noted per HPI  Allergies  Allergen Reactions  . Penicillin G Rash    Also itching    Past Medical History:  Diagnosis Date  . Hypertension     No past surgical history on file.  Social History   Socioeconomic History  . Marital status: Married    Spouse name: Not on file  . Number of children: Not on file  . Years of education: Not on file  . Highest education level: Not on file  Occupational History  . Not on file  Tobacco Use  . Smoking status: Never Smoker  . Smokeless tobacco: Former    Types: Snuff  Substance and Sexual Activity  . Alcohol use: No    Comment: Prior developing problem. Now abstinent  . Drug use: No  . Sexual activity: Yes  Other Topics Concern  . Not on file  Social History Narrative  . Not on file   Social Determinants of Health   Financial Resource Strain:   . Difficulty of Paying Living Expenses:   Food Insecurity:   . Worried About Neurosurgeon in the Last Year:   . Programme researcher, broadcasting/film/video in the Last Year:   Transportation Needs:   . Barista (Medical):   Freight forwarder Lack of Transportation (Non-Medical):   Physical Activity:   . Days of Exercise per Week:   . Minutes of Exercise per Session:   Stress:   . Feeling of Stress :   Social Connections:   . Frequency of Communication with Friends and Family:   .  Frequency of Social Gatherings with Friends and Family:   . Attends Religious Services:   . Active Member of Clubs or Organizations:   . Attends [employer] Meetings:   Banker Marital Status:     Family History  Problem Relation Age of Onset  . Hyperlipidemia Mother   . Hypertension Mother   . Hyperlipidemia Father   . Hypertension Father   . Alcohol abuse Maternal Grandmother   . Cancer Maternal Grandmother   . Alcohol abuse Maternal Grandfather   . Cancer Maternal Grandfather   . Hyperlipidemia Maternal Grandfather   . Hypertension Maternal Grandfather   . Alcohol abuse Paternal Grandmother   . Cancer Paternal Grandmother   . Hyperlipidemia Paternal Grandmother   . Hypertension Paternal Grandmother   . Alcohol abuse Paternal Grandfather   . Cancer Paternal Grandfather     Health Maintenance  Topic Date Due  . Hepatitis C Screening  Never done  . INFLUENZA VACCINE  07/12/2020  . TETANUS/TDAP  09/14/2026  . HIV Screening  Completed     ----------------------------------------------------------------------------------------------------------------------------------------------------------------------------------------------------------------- Physical Exam BP 130/86 (BP Location: Left Arm, Patient Position: Sitting, Cuff Size: Normal)   Pulse 88   Temp 98.7 F (37.1 C) (Oral)   Ht 5' 10.08" (1.78  m)   Wt 205 lb 8 oz (93.2 kg)   SpO2 99%   BMI 29.42 kg/m   Physical Exam Constitutional:      Appearance: Normal appearance.  HENT:     Head: Normocephalic and atraumatic.  Eyes:     General: No scleral icterus. Cardiovascular:     Rate and Rhythm: Normal rate and regular rhythm.  Pulmonary:     Effort: Pulmonary effort is normal.     Breath sounds: Normal breath sounds.  Musculoskeletal:     Cervical back: Neck supple.  Neurological:     General: No focal deficit present.     Mental Status: He is alert.  Psychiatric:        Mood and Affect: Mood normal.         Behavior: Behavior normal.     ------------------------------------------------------------------------------------------------------------------------------------------------------------------------------------------------------------------- Assessment and Plan  HTN (hypertension) Blood pressure is at goal at for age and co-morbidities.  I recommend continuation of current medications.  In addition they were instructed to follow a low sodium diet with regular exercise to help to maintain adequate control of blood pressure.    Alcohol use He has reduced EtOH intake significantly, encouraged to keep intake low.   Hyperlipidemia Update lipid panel.    Meds ordered this encounter  Medications  . lisinopril-hydrochlorothiazide (ZESTORETIC) 20-25 MG tablet    Sig: Take 1 tablet by mouth daily.    Dispense:  90 tablet    Refill:  3  . amLODipine (NORVASC) 10 MG tablet    Sig: Take 1 tablet (10 mg total) by mouth daily.    Dispense:  90 tablet    Refill:  3    No follow-ups on file.    This visit occurred during the SARS-CoV-2 public health emergency.  Safety protocols were in place, including screening questions prior to the visit, additional usage of staff PPE, and extensive cleaning of exam room while observing appropriate contact time as indicated for disinfecting solutions.

## 2020-08-13 DIAGNOSIS — H40053 Ocular hypertension, bilateral: Secondary | ICD-10-CM | POA: Diagnosis not present

## 2020-09-22 DIAGNOSIS — Z23 Encounter for immunization: Secondary | ICD-10-CM | POA: Diagnosis not present

## 2020-09-22 DIAGNOSIS — S51811A Laceration without foreign body of right forearm, initial encounter: Secondary | ICD-10-CM | POA: Diagnosis not present

## 2020-09-22 DIAGNOSIS — W268XXA Contact with other sharp object(s), not elsewhere classified, initial encounter: Secondary | ICD-10-CM | POA: Diagnosis not present

## 2020-10-03 DIAGNOSIS — Z4802 Encounter for removal of sutures: Secondary | ICD-10-CM | POA: Diagnosis not present

## 2021-11-01 DIAGNOSIS — H903 Sensorineural hearing loss, bilateral: Secondary | ICD-10-CM | POA: Diagnosis not present

## 2021-12-02 DIAGNOSIS — H903 Sensorineural hearing loss, bilateral: Secondary | ICD-10-CM | POA: Diagnosis not present

## 2022-01-03 DIAGNOSIS — H903 Sensorineural hearing loss, bilateral: Secondary | ICD-10-CM | POA: Diagnosis not present

## 2022-01-24 DIAGNOSIS — J029 Acute pharyngitis, unspecified: Secondary | ICD-10-CM | POA: Diagnosis not present

## 2022-01-24 DIAGNOSIS — Z20822 Contact with and (suspected) exposure to covid-19: Secondary | ICD-10-CM | POA: Diagnosis not present

## 2022-01-24 DIAGNOSIS — R059 Cough, unspecified: Secondary | ICD-10-CM | POA: Diagnosis not present

## 2022-01-24 DIAGNOSIS — R0981 Nasal congestion: Secondary | ICD-10-CM | POA: Diagnosis not present

## 2022-02-01 DIAGNOSIS — Z20822 Contact with and (suspected) exposure to covid-19: Secondary | ICD-10-CM | POA: Diagnosis not present

## 2022-02-01 DIAGNOSIS — R569 Unspecified convulsions: Secondary | ICD-10-CM | POA: Diagnosis not present

## 2022-02-01 DIAGNOSIS — F1090 Alcohol use, unspecified, uncomplicated: Secondary | ICD-10-CM | POA: Diagnosis not present

## 2022-02-01 DIAGNOSIS — E871 Hypo-osmolality and hyponatremia: Secondary | ICD-10-CM | POA: Diagnosis not present

## 2022-02-01 DIAGNOSIS — R9431 Abnormal electrocardiogram [ECG] [EKG]: Secondary | ICD-10-CM | POA: Diagnosis not present

## 2022-02-01 DIAGNOSIS — I10 Essential (primary) hypertension: Secondary | ICD-10-CM | POA: Diagnosis not present

## 2022-02-01 DIAGNOSIS — R519 Headache, unspecified: Secondary | ICD-10-CM | POA: Diagnosis not present

## 2022-02-01 DIAGNOSIS — G988 Other disorders of nervous system: Secondary | ICD-10-CM | POA: Diagnosis not present

## 2022-02-01 DIAGNOSIS — Z79899 Other long term (current) drug therapy: Secondary | ICD-10-CM | POA: Diagnosis not present

## 2022-02-01 DIAGNOSIS — R197 Diarrhea, unspecified: Secondary | ICD-10-CM | POA: Diagnosis not present

## 2022-02-01 DIAGNOSIS — H9042 Sensorineural hearing loss, unilateral, left ear, with unrestricted hearing on the contralateral side: Secondary | ICD-10-CM | POA: Diagnosis not present

## 2022-02-02 DIAGNOSIS — R55 Syncope and collapse: Secondary | ICD-10-CM | POA: Diagnosis not present

## 2022-02-02 DIAGNOSIS — H9192 Unspecified hearing loss, left ear: Secondary | ICD-10-CM | POA: Diagnosis not present

## 2022-02-02 DIAGNOSIS — I10 Essential (primary) hypertension: Secondary | ICD-10-CM | POA: Diagnosis not present

## 2022-02-02 DIAGNOSIS — F1010 Alcohol abuse, uncomplicated: Secondary | ICD-10-CM | POA: Diagnosis not present

## 2022-02-02 DIAGNOSIS — E871 Hypo-osmolality and hyponatremia: Secondary | ICD-10-CM | POA: Diagnosis not present

## 2022-02-05 DIAGNOSIS — E871 Hypo-osmolality and hyponatremia: Secondary | ICD-10-CM | POA: Diagnosis not present

## 2022-02-05 DIAGNOSIS — I10 Essential (primary) hypertension: Secondary | ICD-10-CM | POA: Diagnosis not present

## 2022-02-05 DIAGNOSIS — F1090 Alcohol use, unspecified, uncomplicated: Secondary | ICD-10-CM | POA: Diagnosis not present

## 2022-03-16 DIAGNOSIS — I10 Essential (primary) hypertension: Secondary | ICD-10-CM | POA: Diagnosis not present

## 2022-03-16 DIAGNOSIS — R9431 Abnormal electrocardiogram [ECG] [EKG]: Secondary | ICD-10-CM | POA: Diagnosis not present

## 2022-03-16 DIAGNOSIS — R079 Chest pain, unspecified: Secondary | ICD-10-CM | POA: Diagnosis not present

## 2022-03-16 DIAGNOSIS — Z880 Allergy status to penicillin: Secondary | ICD-10-CM | POA: Diagnosis not present

## 2022-03-16 DIAGNOSIS — R002 Palpitations: Secondary | ICD-10-CM | POA: Diagnosis not present

## 2022-03-16 DIAGNOSIS — F439 Reaction to severe stress, unspecified: Secondary | ICD-10-CM | POA: Diagnosis not present

## 2022-03-21 ENCOUNTER — Telehealth: Payer: Self-pay | Admitting: General Practice

## 2022-03-21 NOTE — Telephone Encounter (Signed)
Transition Care Management Unsuccessful Follow-up Telephone Call ? ?Date of discharge and from where:  03/16/22 from [facility] ? ?Attempts:  1st Attempt ? ?Reason for unsuccessful TCM follow-up call:  No answer/busy ? ?  ?

## 2022-03-23 NOTE — Telephone Encounter (Signed)
Transition Care Management Unsuccessful Follow-up Telephone Call ? ?Date of discharge and from where:  03/16/22 from [facility] ? ?Attempts:  2nd Attempt ? ?Reason for unsuccessful TCM follow-up call:  No answer/busy ? ?  ?

## 2022-03-28 NOTE — Telephone Encounter (Signed)
Transition Care Management Unsuccessful Follow-up Telephone Call ? ?Date of discharge and from where:  03/16/22 from [facility] ? ?Attempts:  3rd Attempt ? ?Reason for unsuccessful TCM follow-up call:  No answer/busy ? ?  ?

## 2022-03-29 ENCOUNTER — Other Ambulatory Visit: Payer: Self-pay

## 2022-03-29 MED ORDER — AMLODIPINE BESYLATE 10 MG PO TABS
10.0000 mg | ORAL_TABLET | Freq: Every day | ORAL | 0 refills | Status: DC
Start: 2022-03-29 — End: 2022-04-29

## 2022-03-29 MED ORDER — LISINOPRIL-HYDROCHLOROTHIAZIDE 20-25 MG PO TABS: 1.0000 | ORAL_TABLET | Freq: Every day | ORAL | 0 refills | Status: DC

## 2022-04-29 ENCOUNTER — Ambulatory Visit: Payer: BC Managed Care – PPO | Admitting: Family Medicine

## 2022-04-29 ENCOUNTER — Encounter: Payer: Self-pay | Admitting: Family Medicine

## 2022-04-29 DIAGNOSIS — F1090 Alcohol use, unspecified, uncomplicated: Secondary | ICD-10-CM | POA: Diagnosis not present

## 2022-04-29 DIAGNOSIS — I10 Essential (primary) hypertension: Secondary | ICD-10-CM | POA: Diagnosis not present

## 2022-04-29 MED ORDER — LISINOPRIL-HYDROCHLOROTHIAZIDE 20-25 MG PO TABS: 1.0000 | ORAL_TABLET | Freq: Every day | ORAL | 3 refills | Status: DC

## 2022-04-29 MED ORDER — AMLODIPINE BESYLATE 10 MG PO TABS: 10.0000 mg | ORAL_TABLET | Freq: Every day | ORAL | 3 refills | Status: DC

## 2022-04-29 NOTE — Assessment & Plan Note (Signed)
Sober for nearly 3 months.  No cravings.  Managing stress fairly well at this time.

## 2022-04-29 NOTE — Progress Notes (Signed)
Lucas Rodriguez - 41 y.o. male MRN [id number]  Date of birth: 1981/01/12  Subjective Chief Complaint  Patient presents with   Hypertension    HPI Lucas Rodriguez is a 41 y.o. male here today for follow up visit.  He was seen in the ED on 03/16/2022 for EtOH withdrawal.  Stopped drinking 6 weeks prior to visit.  Had syncopal episode and vomiting.  No known seizure activity with this. Had cardiac work up which was normal.   He has abstained from EtOH since February.  He is doing much better.  Stress is improved.    BP is well controlled today.  He is tolerating well without side effects.  Denies chest pain, shortness of breath, palpitations, headache or vision changes.    ROS:  A comprehensive ROS was completed and negative except as noted per HPI  Allergies  Allergen Reactions   Penicillin G Rash    Also itching    Past Medical History:  Diagnosis Date   Hypertension     History reviewed. No pertinent surgical history.  Social History   Socioeconomic History   Marital status: Married    Spouse name: Not on file   Number of children: Not on file   Years of education: Not on file   Highest education level: Not on file  Occupational History   Not on file  Tobacco Use   Smoking status: Never   Smokeless tobacco: Former    Types: Snuff  Substance and Sexual Activity   Alcohol use: No    Comment: Prior developing problem. Now abstinent   Drug use: No   Sexual activity: Yes  Other Topics Concern   Not on file  Social History Narrative   Not on file   Social Determinants of Health   Financial Resource Strain: Not on file  Food Insecurity: Not on file  Transportation Needs: Not on file  Physical Activity: Not on file  Stress: Not on file  Social Connections: Not on file    Family History  Problem Relation Age of Onset   Hyperlipidemia Mother    Hypertension Mother    Hyperlipidemia Father    Hypertension Father    Alcohol abuse Maternal Grandmother    Cancer  Maternal Grandmother    Alcohol abuse Maternal Grandfather    Cancer Maternal Grandfather    Hyperlipidemia Maternal Grandfather    Hypertension Maternal Grandfather    Alcohol abuse Paternal Grandmother    Cancer Paternal Grandmother    Hyperlipidemia Paternal Grandmother    Hypertension Paternal Grandmother    Alcohol abuse Paternal Grandfather    Cancer Paternal Grandfather     Health Maintenance  Topic Date Due   Hepatitis C Screening  Never done   INFLUENZA VACCINE  07/12/2022   TETANUS/TDAP  09/22/2030   HIV Screening  Completed   HPV VACCINES  Aged Out     ----------------------------------------------------------------------------------------------------------------------------------------------------------------------------------------------------------------- Physical Exam BP 125/76 (BP Location: Left Arm, Patient Position: Sitting, Cuff Size: Normal)   Pulse 71   Ht 5\' 10"  (1.778 m)   Wt 200 lb (90.7 kg)   SpO2 98%   BMI 28.70 kg/m   Physical Exam Constitutional:      Appearance: Normal appearance.  Eyes:     General: No scleral icterus. Cardiovascular:     Rate and Rhythm: Normal rate and regular rhythm.  Pulmonary:     Effort: Pulmonary effort is normal.     Breath sounds: Normal breath sounds.  Musculoskeletal:     Cervical  back: Neck supple.  Neurological:     General: No focal deficit present.     Mental Status: He is alert.  Psychiatric:        Mood and Affect: Mood normal.    ------------------------------------------------------------------------------------------------------------------------------------------------------------------------------------------------------------------- Assessment and Plan  HTN (hypertension) Blood pressure is at goal at for age and co-morbidities.  I recommend continuation of current medications.  In addition they were instructed to follow a low sodium diet with regular exercise to help to maintain adequate  control of blood pressure.    Alcohol use disorder Sober for nearly 3 months.  No cravings.  Managing stress fairly well at this time.     Meds ordered this encounter  Medications   DISCONTD: amLODipine (NORVASC) 10 MG tablet    Sig: Take 1 tablet (10 mg total) by mouth daily.    Dispense:  90 tablet    Refill:  3   DISCONTD: lisinopril-hydrochlorothiazide (ZESTORETIC) 20-25 MG tablet    Sig: Take 1 tablet by mouth daily.    Dispense:  90 tablet    Refill:  3   DISCONTD: amLODipine (NORVASC) 10 MG tablet    Sig: Take 1 tablet (10 mg total) by mouth daily.    Dispense:  90 tablet    Refill:  3   DISCONTD: lisinopril-hydrochlorothiazide (ZESTORETIC) 20-25 MG tablet    Sig: Take 1 tablet by mouth daily.    Dispense:  90 tablet    Refill:  3   DISCONTD: amLODipine (NORVASC) 10 MG tablet    Sig: Take 1 tablet (10 mg total) by mouth daily.    Dispense:  90 tablet    Refill:  3   DISCONTD: lisinopril-hydrochlorothiazide (ZESTORETIC) 20-25 MG tablet    Sig: Take 1 tablet by mouth daily.    Dispense:  90 tablet    Refill:  3   amLODipine (NORVASC) 10 MG tablet    Sig: Take 1 tablet (10 mg total) by mouth daily.    Dispense:  90 tablet    Refill:  3   lisinopril-hydrochlorothiazide (ZESTORETIC) 20-25 MG tablet    Sig: Take 1 tablet by mouth daily.    Dispense:  90 tablet    Refill:  3    Return in about 6 months (around 10/30/2022) for HTN.    This visit occurred during the SARS-CoV-2 public health emergency.  Safety protocols were in place, including screening questions prior to the visit, additional usage of staff PPE, and extensive cleaning of exam room while observing appropriate contact time as indicated for disinfecting solutions.

## 2022-04-29 NOTE — Assessment & Plan Note (Signed)
Blood pressure is at goal at for age and co-morbidities.  I recommend continuation of current medications.  In addition they were instructed to follow a low sodium diet with regular exercise to help to maintain adequate control of blood pressure.  ? ?

## 2022-05-04 ENCOUNTER — Other Ambulatory Visit: Payer: Self-pay

## 2022-05-04 DIAGNOSIS — I10 Essential (primary) hypertension: Secondary | ICD-10-CM

## 2022-05-04 MED ORDER — LISINOPRIL-HYDROCHLOROTHIAZIDE 20-25 MG PO TABS: 1.0000 | ORAL_TABLET | Freq: Every day | ORAL | 3 refills | Status: DC

## 2022-05-04 MED ORDER — AMLODIPINE BESYLATE 10 MG PO TABS: 10.0000 mg | ORAL_TABLET | Freq: Every day | ORAL | 3 refills | Status: DC

## 2022-05-17 ENCOUNTER — Ambulatory Visit (INDEPENDENT_AMBULATORY_CARE_PROVIDER_SITE_OTHER): Payer: BC Managed Care – PPO

## 2022-05-17 ENCOUNTER — Ambulatory Visit: Payer: BC Managed Care – PPO | Admitting: Sports Medicine

## 2022-05-17 DIAGNOSIS — M25552 Pain in left hip: Secondary | ICD-10-CM

## 2022-05-17 DIAGNOSIS — M25551 Pain in right hip: Secondary | ICD-10-CM | POA: Diagnosis not present

## 2022-05-17 DIAGNOSIS — M87051 Idiopathic aseptic necrosis of right femur: Secondary | ICD-10-CM | POA: Insufficient documentation

## 2022-05-17 MED ORDER — MELOXICAM 15 MG PO TABS: ORAL_TABLET | ORAL | 3 refills | Status: DC

## 2022-05-17 NOTE — Progress Notes (Signed)
    Procedures performed today:    None.  Independent interpretation of notes and tests performed by another provider:   None.  Brief History, Exam, Impression, and Recommendations:    Bilateral hip pain Pleasant 41 year old male, long history of bilateral hip pain, left far worse than right. Recently having difficulty walking, pain is in the groin with occasional popping sensations. No trauma. On exam he has very limited internal rotation on the left to about 10 degrees, 45 degrees on the right, both with pain. Differential includes AVN, labral tear, osteoarthritis. Adding bilateral hip x-rays, meloxicam, out of work for the rest of the week, home conditioning. Return to see me in 4 to 6 weeks, MRI arthrogram left hip if no better.  Chronic process with exacerbation and pharmacologic intervention  ___________________________________________ Ihor Austin. Benjamin Stain, M.D., ABFM., CAQSM. Primary Care and Sports Medicine [facility]  Adjunct Instructor of Family Medicine  [employer]

## 2022-05-17 NOTE — Assessment & Plan Note (Signed)
Pleasant 41 year old male, long history of bilateral hip pain, left far worse than right. Recently having difficulty walking, pain is in the groin with occasional popping sensations. No trauma. On exam he has very limited internal rotation on the left to about 10 degrees, 45 degrees on the right, both with pain. Differential includes AVN, labral tear, osteoarthritis. Adding bilateral hip x-rays, meloxicam, out of work for the rest of the week, home conditioning. Return to see me in 4 to 6 weeks, MRI arthrogram left hip if no better.

## 2022-05-17 NOTE — Addendum Note (Signed)
Addended by: Monica Becton on: 05/17/2022 03:35 PM   Modules accepted: Orders

## 2022-05-19 ENCOUNTER — Other Ambulatory Visit: Payer: Self-pay

## 2022-05-19 DIAGNOSIS — I10 Essential (primary) hypertension: Secondary | ICD-10-CM

## 2022-05-19 MED ORDER — AMLODIPINE BESYLATE 10 MG PO TABS: 10.0000 mg | ORAL_TABLET | Freq: Every day | ORAL | 3 refills | Status: DC

## 2022-05-19 MED ORDER — LISINOPRIL-HYDROCHLOROTHIAZIDE 20-25 MG PO TABS: 1.0000 | ORAL_TABLET | Freq: Every day | ORAL | 3 refills | Status: DC

## 2022-06-07 ENCOUNTER — Encounter: Payer: Self-pay | Admitting: Sports Medicine

## 2022-06-13 ENCOUNTER — Ambulatory Visit: Payer: BC Managed Care – PPO | Admitting: Sports Medicine

## 2022-06-17 ENCOUNTER — Ambulatory Visit: Payer: BC Managed Care – PPO | Admitting: Sports Medicine

## 2022-06-17 DIAGNOSIS — M87051 Idiopathic aseptic necrosis of right femur: Secondary | ICD-10-CM

## 2022-06-17 DIAGNOSIS — M87052 Idiopathic aseptic necrosis of left femur: Secondary | ICD-10-CM | POA: Diagnosis not present

## 2022-06-17 MED ORDER — ALENDRONATE SODIUM 70 MG PO TABS: 70.0000 mg | ORAL_TABLET | ORAL | 11 refills | Status: DC

## 2022-06-17 NOTE — Assessment & Plan Note (Signed)
This is a very pleasant 41 year old male, to recap, he has a longstanding history of bilateral hip pain, right worse than left. More recently he has had worsening difficulty walking, pain in the groin with occasional popping sensations. Very limited internal rotation on both hips, and internal rotation does recreate pain. We did suspect AVN versus labral tearing versus osteoarthritis. X-rays did show some patchy changes in the femoral heads consistent with AVN. There was not significant femoral head collapse. On further questioning he did have a significant alcohol intake history sometimes drinking over 30 beers per day. He has stopped alcohol consumption since February. I explained to him the difficulty in treating hip AVN, he is happy with meloxicam, we will not escalate from here, we will add Fosamax to help prevent femoral head collapse and I would like an MRI for confirmation of the diagnosis and a consultation with Dr. Luiz Blare.

## 2022-06-17 NOTE — Progress Notes (Signed)
    Procedures performed today:    None.  Independent interpretation of notes and tests performed by another provider:   None.  Brief History, Exam, Impression, and Recommendations:    Avascular necrosis of bones of both hips (HCC) This is a very pleasant 41 year old male, to recap, he has a longstanding history of bilateral hip pain, right worse than left. More recently he has had worsening difficulty walking, pain in the groin with occasional popping sensations. Very limited internal rotation on both hips, and internal rotation does recreate pain. We did suspect AVN versus labral tearing versus osteoarthritis. X-rays did show some patchy changes in the femoral heads consistent with AVN. There was not significant femoral head collapse. On further questioning he did have a significant alcohol intake history sometimes drinking over 30 beers per day. He has stopped alcohol consumption since February. I explained to him the difficulty in treating hip AVN, he is happy with meloxicam, we will not escalate from here, we will add Fosamax to help prevent femoral head collapse and I would like an MRI for confirmation of the diagnosis and a consultation with Dr. Luiz Blare.    ____________________________________________ Ihor Austin. Benjamin Stain, M.D., ABFM., CAQSM., AME. Primary Care and Sports Medicine [facility]  Adjunct Professor of Family Medicine  [place] [employer]  Restaurant manager, fast food

## 2022-06-21 ENCOUNTER — Ambulatory Visit (INDEPENDENT_AMBULATORY_CARE_PROVIDER_SITE_OTHER): Payer: BC Managed Care – PPO

## 2022-06-21 DIAGNOSIS — M25551 Pain in right hip: Secondary | ICD-10-CM

## 2022-06-21 DIAGNOSIS — G8929 Other chronic pain: Secondary | ICD-10-CM

## 2022-06-21 DIAGNOSIS — M87051 Idiopathic aseptic necrosis of right femur: Secondary | ICD-10-CM | POA: Diagnosis not present

## 2022-06-21 DIAGNOSIS — M87052 Idiopathic aseptic necrosis of left femur: Secondary | ICD-10-CM

## 2022-06-21 DIAGNOSIS — M25451 Effusion, right hip: Secondary | ICD-10-CM | POA: Diagnosis not present

## 2022-06-23 ENCOUNTER — Encounter (INDEPENDENT_AMBULATORY_CARE_PROVIDER_SITE_OTHER): Payer: BC Managed Care – PPO | Admitting: Sports Medicine

## 2022-06-23 DIAGNOSIS — M87051 Idiopathic aseptic necrosis of right femur: Secondary | ICD-10-CM | POA: Diagnosis not present

## 2022-06-23 DIAGNOSIS — M87052 Idiopathic aseptic necrosis of left femur: Secondary | ICD-10-CM | POA: Diagnosis not present

## 2022-06-24 MED ORDER — TRAMADOL HCL 50 MG PO TABS: 50.0000 mg | ORAL_TABLET | Freq: Three times a day (TID) | ORAL | 0 refills | Status: DC | PRN

## 2022-06-24 NOTE — Telephone Encounter (Signed)
I spent 5 total minutes of online digital evaluation and management services in this patient-initiated request for online care. 

## 2022-06-27 DIAGNOSIS — M87051 Idiopathic aseptic necrosis of right femur: Secondary | ICD-10-CM | POA: Diagnosis not present

## 2022-06-28 ENCOUNTER — Telehealth: Payer: Self-pay

## 2022-06-28 NOTE — Telephone Encounter (Signed)
Please contact pt to schedule hip replacement pre-op visit. Pt should bring any paperwork required by [facility] for clearance to appointment.   Thanks

## 2022-06-29 NOTE — Telephone Encounter (Signed)
Patient has been scheduled for 07/06/2022 with PCP for pre-op. Patient stated he did not have any paperwork from [facility] but he would call them to double check and make sure they didn't have anything they left out for him. AMUCK

## 2022-07-06 ENCOUNTER — Encounter: Payer: Self-pay | Admitting: Family Medicine

## 2022-07-06 ENCOUNTER — Ambulatory Visit: Payer: BC Managed Care – PPO | Admitting: Family Medicine

## 2022-07-06 VITALS — BP 111/71 | HR 72 | Ht 70.0 in | Wt 201.0 lb

## 2022-07-06 DIAGNOSIS — I10 Essential (primary) hypertension: Secondary | ICD-10-CM | POA: Diagnosis not present

## 2022-07-06 DIAGNOSIS — Z01818 Encounter for other preprocedural examination: Secondary | ICD-10-CM | POA: Diagnosis not present

## 2022-07-06 NOTE — Progress Notes (Signed)
Lucas Rodriguez - 41 y.o. male MRN [id number]  Date of birth: [birth year]  Subjective Chief Complaint  Patient presents with   Pre-op Exam    HPI Lucas Rodriguez is a 41 y.o. male here today for pre-op visit.   He is scheduled for R THA with spinal anesthesia.  Pre-op forms indicate that his orthopedic office will check pre-op labs.  He has appt on 7/31 for this.   He has had history of HTN.  This is well controlled with current medications of amlodipine and lisinopril/hctz.  No side effects with medication.  Denies anginal symptoms, dyspnea, headacher or vision changes.   He has history of alcohol use disorder.  He reports that he has been sober since February of this year.   ROS:  A comprehensive ROS was completed and negative except as noted per HPI   Allergies  Allergen Reactions   Penicillin G Rash    Also itching    Past Medical History:  Diagnosis Date   Hypertension     No past surgical history on file.  Social History   Socioeconomic History   Marital status: Married    Spouse name: Not on file   Number of children: Not on file   Years of education: Not on file   Highest education level: Not on file  Occupational History   Not on file  Tobacco Use   Smoking status: Never   Smokeless tobacco: Former    Types: Snuff  Substance and Sexual Activity   Alcohol use: No    Comment: Prior developing problem. Now abstinent   Drug use: No   Sexual activity: Yes  Other Topics Concern   Not on file  Social History Narrative   Not on file   Social Determinants of Health   Financial Resource Strain: Not on file  Food Insecurity: Not on file  Transportation Needs: Not on file  Physical Activity: Not on file  Stress: Not on file  Social Connections: Not on file    Family History  Problem Relation Age of Onset   Hyperlipidemia Mother    Hypertension Mother    Hyperlipidemia Father    Hypertension Father    Alcohol abuse Maternal Grandmother    Cancer Maternal  Grandmother    Alcohol abuse Maternal Grandfather    Cancer Maternal Grandfather    Hyperlipidemia Maternal Grandfather    Hypertension Maternal Grandfather    Alcohol abuse Paternal Grandmother    Cancer Paternal Grandmother    Hyperlipidemia Paternal Grandmother    Hypertension Paternal Grandmother    Alcohol abuse Paternal Grandfather    Cancer Paternal Grandfather     Health Maintenance  Topic Date Due   Hepatitis C Screening  07/07/2023 (Originally 07/25/2001)   INFLUENZA VACCINE  07/12/2022   TETANUS/TDAP  09/22/2030   HIV Screening  Completed   HPV VACCINES  Aged Out     ----------------------------------------------------------------------------------------------------------------------------------------------------------------------------------------------------------------- Physical Exam BP 111/71 (BP Location: Left Arm, Patient Position: Sitting, Cuff Size: Normal)   Pulse 72   Ht 5\' 10"  (1.778 m)   Wt 201 lb (91.2 kg)   SpO2 99%   BMI 28.84 kg/m   Physical Exam Constitutional:      Appearance: Normal appearance.  HENT:     Head: Normocephalic and atraumatic.     Mouth/Throat:     Mouth: Mucous membranes are moist.  Eyes:     General: No scleral icterus. Cardiovascular:     Rate and Rhythm: Normal rate and regular rhythm.  Pulmonary:     Effort: Pulmonary effort is normal.     Breath sounds: Normal breath sounds.  Musculoskeletal:     Cervical back: Neck supple.  Lymphadenopathy:     Cervical: No cervical adenopathy.  Neurological:     General: No focal deficit present.     Mental Status: He is alert.  Psychiatric:        Mood and Affect: Mood normal.        Behavior: Behavior normal.     ------------------------------------------------------------------------------------------------------------------------------------------------------------------------------------------------------------------- Assessment and Plan  HTN (hypertension) BP is  well controlled at this time.  Continue current medications.   Pre-operative clearance He is low risk for orthopedic surgery.  Forms completed to be faxed back to Dr. Luiz Blare office.     No orders of the defined types were placed in this encounter.   No follow-ups on file.    This visit occurred during the SARS-CoV-2 public health emergency.  Safety protocols were in place, including screening questions prior to the visit, additional usage of staff PPE, and extensive cleaning of exam room while observing appropriate contact time as indicated for disinfecting solutions.

## 2022-07-06 NOTE — Assessment & Plan Note (Signed)
BP is well controlled at this time.  Continue current medications.

## 2022-07-06 NOTE — Assessment & Plan Note (Addendum)
He is low risk for orthopedic surgery.  Labs to be drawn at orthopedic office.  Forms completed to be faxed back to Dr. Luiz Blare office.

## 2022-07-11 DIAGNOSIS — R262 Difficulty in walking, not elsewhere classified: Secondary | ICD-10-CM | POA: Diagnosis not present

## 2022-07-11 DIAGNOSIS — M25651 Stiffness of right hip, not elsewhere classified: Secondary | ICD-10-CM | POA: Diagnosis not present

## 2022-07-11 DIAGNOSIS — M1611 Unilateral primary osteoarthritis, right hip: Secondary | ICD-10-CM | POA: Diagnosis not present

## 2022-07-11 DIAGNOSIS — M25551 Pain in right hip: Secondary | ICD-10-CM | POA: Diagnosis not present

## 2022-07-14 ENCOUNTER — Other Ambulatory Visit: Payer: Self-pay | Admitting: Sports Medicine

## 2022-07-14 DIAGNOSIS — M87051 Idiopathic aseptic necrosis of right femur: Secondary | ICD-10-CM

## 2022-07-14 NOTE — Telephone Encounter (Signed)
Routing to covering provider.   Last OV: 07/06/22 Next OV: 10/31/22 Last RF: 06/24/22

## 2022-07-15 MED ORDER — TRAMADOL HCL 50 MG PO TABS: 50.0000 mg | ORAL_TABLET | Freq: Three times a day (TID) | ORAL | 0 refills | Status: DC | PRN

## 2022-07-27 DIAGNOSIS — M1611 Unilateral primary osteoarthritis, right hip: Secondary | ICD-10-CM | POA: Diagnosis not present

## 2022-08-15 ENCOUNTER — Other Ambulatory Visit: Payer: Self-pay | Admitting: Sports Medicine

## 2022-08-15 DIAGNOSIS — M87052 Idiopathic aseptic necrosis of left femur: Secondary | ICD-10-CM

## 2022-08-15 DIAGNOSIS — M87051 Idiopathic aseptic necrosis of right femur: Secondary | ICD-10-CM

## 2022-08-17 ENCOUNTER — Encounter: Payer: Self-pay | Admitting: Sports Medicine

## 2022-09-06 ENCOUNTER — Ambulatory Visit: Payer: BC Managed Care – PPO | Admitting: Sports Medicine

## 2022-09-28 DIAGNOSIS — M1612 Unilateral primary osteoarthritis, left hip: Secondary | ICD-10-CM | POA: Diagnosis not present

## 2022-10-31 ENCOUNTER — Ambulatory Visit (INDEPENDENT_AMBULATORY_CARE_PROVIDER_SITE_OTHER): Payer: BC Managed Care – PPO | Admitting: Family Medicine

## 2022-10-31 ENCOUNTER — Encounter: Payer: Self-pay | Admitting: Family Medicine

## 2022-10-31 VITALS — BP 110/66 | HR 75 | Ht 70.0 in | Wt 196.0 lb

## 2022-10-31 DIAGNOSIS — I10 Essential (primary) hypertension: Secondary | ICD-10-CM

## 2022-10-31 DIAGNOSIS — M87051 Idiopathic aseptic necrosis of right femur: Secondary | ICD-10-CM

## 2022-10-31 DIAGNOSIS — M87052 Idiopathic aseptic necrosis of left femur: Secondary | ICD-10-CM

## 2022-10-31 NOTE — Assessment & Plan Note (Signed)
S/p bilateral replacement.  Doing quite well with this.

## 2022-10-31 NOTE — Assessment & Plan Note (Signed)
BP remains well controlled with current medications.  Recommend continuation with follow up in 6 months or sooner if needed.

## 2022-10-31 NOTE — Progress Notes (Signed)
Lucas Rodriguez - 40 y.o. male MRN [id number]  Date of birth: October 13, 1981  Subjective Chief Complaint  Patient presents with   Hypertension    HPI Lucas Rodriguez is a 41 y.o. male here today for follow up visit.    He reports that he is doing well with combination of lisinopril/hctz and amlodipine.  Denies symptoms related to HTN including  chest pain, shortness of breath, palpitations, headache or vision changes. He has lost about 5 lbs since last visit.  Reports that his diet is has been better.    ROS:  A comprehensive ROS was completed and negative except as noted per HPI    Allergies  Allergen Reactions   Penicillin G Rash    Also itching    Past Medical History:  Diagnosis Date   Hypertension     History reviewed. No pertinent surgical history.  Social History   Socioeconomic History   Marital status: Married    Spouse name: Not on file   Number of children: Not on file   Years of education: Not on file   Highest education level: Not on file  Occupational History   Not on file  Tobacco Use   Smoking status: Never   Smokeless tobacco: Former    Types: Snuff  Substance and Sexual Activity   Alcohol use: No    Comment: Prior developing problem. Now abstinent   Drug use: No   Sexual activity: Yes  Other Topics Concern   Not on file  Social History Narrative   Not on file   Social Determinants of Health   Financial Resource Strain: Not on file  Food Insecurity: Not on file  Transportation Needs: Not on file  Physical Activity: Not on file  Stress: Not on file  Social Connections: Not on file    Family History  Problem Relation Age of Onset   Hyperlipidemia Mother    Hypertension Mother    Hyperlipidemia Father    Hypertension Father    Alcohol abuse Maternal Grandmother    Cancer Maternal Grandmother    Alcohol abuse Maternal Grandfather    Cancer Maternal Grandfather    Hyperlipidemia Maternal Grandfather    Hypertension Maternal Grandfather     Alcohol abuse Paternal Grandmother    Cancer Paternal Grandmother    Hyperlipidemia Paternal Grandmother    Hypertension Paternal Grandmother    Alcohol abuse Paternal Grandfather    Cancer Paternal Grandfather     Health Maintenance  Topic Date Due   Hepatitis C Screening  07/07/2023 (Originally 07/25/2001)   INFLUENZA VACCINE  Completed   HIV Screening  Completed   HPV VACCINES  Aged Out     ----------------------------------------------------------------------------------------------------------------------------------------------------------------------------------------------------------------- Physical Exam BP 110/66 (BP Location: Left Arm, Patient Position: Sitting, Cuff Size: Normal)   Pulse 75   Ht 5\' 10"  (1.778 m)   Wt 196 lb (88.9 kg)   SpO2 98%   BMI 28.12 kg/m   Physical Exam Constitutional:      Appearance: Normal appearance.  HENT:     Head: Normocephalic and atraumatic.  Eyes:     General: No scleral icterus. Cardiovascular:     Rate and Rhythm: Normal rate and regular rhythm.  Pulmonary:     Effort: Pulmonary effort is normal.     Breath sounds: Normal breath sounds.  Musculoskeletal:     Cervical back: Neck supple.  Neurological:     Mental Status: He is alert.  Psychiatric:        Mood and Affect: Mood  normal.        Behavior: Behavior normal.     ------------------------------------------------------------------------------------------------------------------------------------------------------------------------------------------------------------------- Assessment and Plan  HTN (hypertension) BP remains well controlled with current medications.  Recommend continuation with follow up in 6 months or sooner if needed.   Avascular necrosis of bones of both hips (HCC) S/p bilateral replacement.  Doing quite well with this.    No orders of the defined types were placed in this encounter.   No follow-ups on file.    This visit occurred  during the SARS-CoV-2 public health emergency.  Safety protocols were in place, including screening questions prior to the visit, additional usage of staff PPE, and extensive cleaning of exam room while observing appropriate contact time as indicated for disinfecting solutions.

## 2022-11-10 DIAGNOSIS — M1612 Unilateral primary osteoarthritis, left hip: Secondary | ICD-10-CM | POA: Diagnosis not present

## 2023-01-16 DIAGNOSIS — M1612 Unilateral primary osteoarthritis, left hip: Secondary | ICD-10-CM | POA: Diagnosis not present

## 2023-02-02 ENCOUNTER — Encounter: Payer: Self-pay | Admitting: Family Medicine

## 2023-02-02 DIAGNOSIS — Z3009 Encounter for other general counseling and advice on contraception: Secondary | ICD-10-CM

## 2023-02-14 ENCOUNTER — Ambulatory Visit: Payer: BC Managed Care – PPO | Admitting: Urology

## 2023-02-14 ENCOUNTER — Encounter: Payer: Self-pay | Admitting: Urology

## 2023-02-14 VITALS — BP 113/72 | HR 88 | Ht 70.0 in | Wt 185.0 lb

## 2023-02-14 DIAGNOSIS — Z3009 Encounter for other general counseling and advice on contraception: Secondary | ICD-10-CM

## 2023-02-14 NOTE — Progress Notes (Signed)
Assessment: 1. Consultation for sterilization      Plan: Lucas Rodriguez and his wife have discussed the issues regarding long-term fertility and are comfortable with this decision.  I discussed the issues in detail with him today and he expressed no reservations.  As to the procedure, no scalpel technique vasectomy is explained and reviewed in detail.  Generalized risks including but not limited to bleeding, infection, orchalgia, testicular atrophy, epididymitis, scrotal hematoma, and chronic pain are discussed.   Additionally, he understands that the possibility of vas recanalization following vasectomy is possible although rare.  Most importantly, the patient understands that he is not sterile initially and will need a semen analysis check to confirm sterility such that no sperm are seen.  He is advised to avoid ejaculation for 10 days following the procedure.  The initial semen analysis will be checked in approximately 12 weeks and in some patients, several months may be required for clearance of all sperm.  He reports a clear understanding of the need for continued birth control until sterility is confirmed.  Otherwise, general issues regarding local anesthesia, prep, alprazolam are discussed and he reports a clear understanding.   Chief Complaint: patient desires vasectomy  History of Present Illness:  Lucas Rodriguez is a 42 y.o. male who is seen in consultation from Luetta Nutting, DO for vasectomy consultation. The patient and his wife have carefully considered permanent sterilization and are comfortable with their decision.  They have no children and do not desire any in the future.   Past Medical History:  Past Medical History:  Diagnosis Date   Hypertension     Past Surgical History:  No past surgical history on file.  Allergies:  Allergies  Allergen Reactions   Penicillin G Rash    Also itching    Family History:  Family History  Problem Relation Age of Onset   Hyperlipidemia  Mother    Hypertension Mother    Hyperlipidemia Father    Hypertension Father    Alcohol abuse Maternal Grandmother    Cancer Maternal Grandmother    Alcohol abuse Maternal Grandfather    Cancer Maternal Grandfather    Hyperlipidemia Maternal Grandfather    Hypertension Maternal Grandfather    Alcohol abuse Paternal Grandmother    Cancer Paternal Grandmother    Hyperlipidemia Paternal Grandmother    Hypertension Paternal Grandmother    Alcohol abuse Paternal Grandfather    Cancer Paternal Grandfather     Social History:  Social History   Tobacco Use   Smoking status: Never   Smokeless tobacco: Former    Types: Snuff  Substance Use Topics   Alcohol use: No    Comment: Prior developing problem. Now abstinent   Drug use: No    Review of symptoms:  Constitutional:  Negative for unexplained weight loss, night sweats, fever, chills ENT:  Negative for nose bleeds, sinus pain, painful swallowing CV:  Negative for chest pain, shortness of breath, exercise intolerance, palpitations, loss of consciousness Resp:  Negative for cough, wheezing, shortness of breath GI:  Negative for nausea, vomiting, diarrhea, bloody stools GU:  Positives noted in HPI; otherwise negative for gross hematuria, dysuria, urinary incontinence Neuro:  Negative for seizures, poor balance, limb weakness, slurred speech Psych:  Negative for lack of energy, depression, anxiety Endocrine:  Negative for polydipsia, polyuria, symptoms of hypoglycemia (dizziness, hunger, sweating) Hematologic:  Negative for anemia, purpura, petechia, prolonged or excessive bleeding, use of anticoagulants  Allergic:  Negative for difficulty breathing or choking as a result of  exposure to anything; no shellfish allergy; no allergic response (rash/itch) to materials, foods  Physical exam: BP 113/72   Pulse 88   Ht '5\' 10"'$  (1.778 m)   Wt 185 lb (83.9 kg)   BMI 26.54 kg/m  GENERAL APPEARANCE:  Well appearing, well developed, well  nourished, NAD  GU: Normal uncircumcised phallus.  Normal testes bilaterally.  Somewhat thickened cord on the left but I can tease out the Vas and it is palpable on the left as well as right.

## 2023-02-15 ENCOUNTER — Telehealth: Payer: Self-pay | Admitting: Urology

## 2023-02-15 NOTE — Telephone Encounter (Signed)
LM to call back to speak with Sharyn Lull about scheduling a Vasectomy

## 2023-02-27 ENCOUNTER — Telehealth: Payer: Self-pay | Admitting: Urology

## 2023-02-27 NOTE — Telephone Encounter (Signed)
Patient is scheduled for a vasectomy on 03/16/23 and is requesting a valium to be called into his pharmacy prior to the procedure.  Thanks, Sharyn Lull

## 2023-03-01 ENCOUNTER — Other Ambulatory Visit: Payer: Self-pay | Admitting: Urology

## 2023-03-01 MED ORDER — ALPRAZOLAM 1 MG PO TABS: 1.0000 mg | ORAL_TABLET | Freq: Once | ORAL | 0 refills | Status: DC | PRN

## 2023-03-16 ENCOUNTER — Encounter: Payer: BC Managed Care – PPO | Admitting: Urology

## 2023-04-06 ENCOUNTER — Encounter: Payer: BC Managed Care – PPO | Admitting: Urology

## 2023-04-06 ENCOUNTER — Ambulatory Visit (INDEPENDENT_AMBULATORY_CARE_PROVIDER_SITE_OTHER): Payer: BC Managed Care – PPO | Admitting: Urology

## 2023-04-06 ENCOUNTER — Encounter: Payer: Self-pay | Admitting: Urology

## 2023-04-06 VITALS — BP 122/75 | HR 84 | Ht 70.0 in | Wt 185.0 lb

## 2023-04-06 DIAGNOSIS — Z302 Encounter for sterilization: Secondary | ICD-10-CM | POA: Diagnosis not present

## 2023-04-06 MED ORDER — LEVOFLOXACIN 500 MG PO TABS: 500.0000 mg | ORAL_TABLET | Freq: Every day | ORAL | 0 refills | Status: AC

## 2023-04-06 NOTE — Patient Instructions (Signed)

## 2023-04-06 NOTE — Progress Notes (Signed)
  Assessment: 1. Encounter for vasectomy     Plan: Post vasectomy instructions given Rx sent Post vasectomy semen analysis in 12 weeks  Chief Complaint: Chief Complaint  Patient presents with   VAS    HPI: Lucas Rodriguez is a 42 y.o. male who presents for vasectomy. He is married with no children.  Portions of the above documentation were copied from a prior visit for review purposes only.  Allergies: Allergies  Allergen Reactions   Penicillin G Rash    Also itching    PMH: Past Medical History:  Diagnosis Date   Hypertension     PSH: No past surgical history on file.  SH: Social History   Tobacco Use   Smoking status: Never   Smokeless tobacco: Former    Types: Snuff  Substance Use Topics   Alcohol use: No    Comment: Prior developing problem. Now abstinent   Drug use: No    ROS: Constitutional:  Negative for fever, chills, weight loss CV: Negative for chest pain, previous MI, hypertension Respiratory:  Negative for shortness of breath, wheezing, sleep apnea, frequent cough GI:  Negative for nausea, vomiting, bloody stool, GERD  PE: BP 122/75   Pulse 84   Ht  (1.778 m)   Wt 185 lb (83.9 kg)   BMI 26.54 kg/m  GENERAL APPEARANCE:  Well appearing, well developed, well nourished, NAD HEENT:  Atraumatic, normocephalic, oropharynx clear NECK:  Supple without lymphadenopathy or thyromegaly ABDOMEN:  Soft, non-tender, no masses EXTREMITIES:  Moves all extremities well, without clubbing, cyanosis, or edema NEUROLOGIC:  Alert and oriented x 3, normal gait, CN II-XII grossly intact MENTAL STATUS:  appropriate BACK:  Non-tender to palpation, No CVAT SKIN:  Warm, dry, and intact   Results: None  VASECTOMY PROCEDURE:  Lucas Rodriguez presents for vasectomy following previous vasectomy consultation and permit is signed.  The patient's anterior scrotal wall is shaved and prepped with Betadine in standard sterile fashion.  1% lidocaine is used as local  anesthetic in the scrotal and peri vasal tissue.  A standard median raphe punch incision is made and a no scalpel technique vasectomy is performed.  Bilateral vas are isolated from the peri vasal tissue and an approximately 1 cm segment of vas is excised.  Proximal and distal segments are internally cauterized with electric heat cautery. Interposition of perivasal tissue was performed.  Bilateral palpation confirms bilateral vasectomy defect and no significant bleeding or hematoma is identified.  Neosporin gauze dressing and a scrotal support are applied.    Disposition: Patient is discharged home with Rx for pain medication and antibiotics.  Patient is given routine vasectomy instructions.   Most importantly, he is instructed and cautioned again regarding the need for protected intercourse until such time that a single  negative semen analysis has been obtained.  The initial semen analysis will be checked in approximately 12  weeks.  The patient reports a clear understanding.  He will call with any interval questions or concerns.

## 2023-04-10 ENCOUNTER — Encounter: Payer: Self-pay | Admitting: Urology

## 2023-04-11 ENCOUNTER — Telehealth: Payer: Self-pay | Admitting: Urology

## 2023-04-11 ENCOUNTER — Encounter: Payer: Self-pay | Admitting: Urology

## 2023-04-11 NOTE — Telephone Encounter (Signed)
Patient had a vasectomy done on 04/06/2023 and he needed a work note written for yesterday (April 29th) through May 1st, tomorrow, as he stated he is still recovering.

## 2023-04-21 ENCOUNTER — Other Ambulatory Visit: Payer: Self-pay | Admitting: Family Medicine

## 2023-04-21 DIAGNOSIS — I10 Essential (primary) hypertension: Secondary | ICD-10-CM

## 2023-05-01 ENCOUNTER — Encounter: Payer: Self-pay | Admitting: Family Medicine

## 2023-05-01 ENCOUNTER — Ambulatory Visit (INDEPENDENT_AMBULATORY_CARE_PROVIDER_SITE_OTHER): Payer: BC Managed Care – PPO | Admitting: Family Medicine

## 2023-05-01 VITALS — BP 110/71 | HR 75 | Ht 70.0 in | Wt 203.0 lb

## 2023-05-01 DIAGNOSIS — I10 Essential (primary) hypertension: Secondary | ICD-10-CM | POA: Diagnosis not present

## 2023-05-01 DIAGNOSIS — Z13220 Encounter for screening for lipoid disorders: Secondary | ICD-10-CM | POA: Diagnosis not present

## 2023-05-01 DIAGNOSIS — Z0000 Encounter for general adult medical examination without abnormal findings: Secondary | ICD-10-CM | POA: Diagnosis not present

## 2023-05-01 NOTE — Progress Notes (Signed)
Lucas Rodriguez - 42 y.o. male MRN [id number]  Date of birth: [birth year]  Subjective Chief Complaint  Patient presents with   Annual Exam    HPI Lucas Rodriguez is a 42 y.o. male here today for annual exam.   He reports that he is doing well.   He has remained abstinent from EtOH.  Prior use of oral tobacco products.    He is moderately active.  He feels that diet is pretty good.   BP is well controlled with current medications.  No side effects at this time.   Review of Systems  Constitutional:  Negative for chills, fever, malaise/fatigue and weight loss.  HENT:  Negative for congestion, ear pain and sore throat.   Eyes:  Negative for blurred vision, double vision and pain.  Respiratory:  Negative for cough and shortness of breath.   Cardiovascular:  Negative for chest pain and palpitations.  Gastrointestinal:  Negative for abdominal pain, blood in stool, constipation, heartburn and nausea.  Genitourinary:  Negative for dysuria and urgency.  Musculoskeletal:  Negative for joint pain and myalgias.  Neurological:  Negative for dizziness and headaches.  Endo/Heme/Allergies:  Does not bruise/bleed easily.  Psychiatric/Behavioral:  Negative for depression. The patient is not nervous/anxious and does not have insomnia.     Allergies  Allergen Reactions   Penicillin G Rash    Also itching    Past Medical History:  Diagnosis Date   Hypertension     History reviewed. No pertinent surgical history.  Social History   Socioeconomic History   Marital status: Married    Spouse name: Not on file   Number of children: Not on file   Years of education: Not on file   Highest education level: Not on file  Occupational History   Not on file  Tobacco Use   Smoking status: Never   Smokeless tobacco: Former    Types: Snuff  Substance and Sexual Activity   Alcohol use: No    Comment: Prior developing problem. Now abstinent   Drug use: No   Sexual activity: Yes  Other Topics  Concern   Not on file  Social History Narrative   Not on file   Social Determinants of Health   Financial Resource Strain: Not on file  Food Insecurity: Not on file  Transportation Needs: Not on file  Physical Activity: Not on file  Stress: Not on file  Social Connections: Not on file    Family History  Problem Relation Age of Onset   Hyperlipidemia Mother    Hypertension Mother    Hyperlipidemia Father    Hypertension Father    Alcohol abuse Maternal Grandmother    Cancer Maternal Grandmother    Alcohol abuse Maternal Grandfather    Cancer Maternal Grandfather    Hyperlipidemia Maternal Grandfather    Hypertension Maternal Grandfather    Alcohol abuse Paternal Grandmother    Cancer Paternal Grandmother    Hyperlipidemia Paternal Grandmother    Hypertension Paternal Grandmother    Alcohol abuse Paternal Grandfather    Cancer Paternal Grandfather     Health Maintenance  Topic Date Due   COVID-19 Vaccine (1) Never done   Hepatitis C Screening  07/07/2023 (Originally 07/25/2001)   INFLUENZA VACCINE  07/13/2023   DTaP/Tdap/Td (3 - Td or Tdap) 09/22/2030   HIV Screening  Completed   HPV VACCINES  Aged Out     ----------------------------------------------------------------------------------------------------------------------------------------------------------------------------------------------------------------- Physical Exam BP 110/71 (BP Location: Left Arm, Patient Position: Sitting, Cuff Size: Normal)   Pulse  75   Ht 5\' 10"  (1.778 m)   Wt 203 lb (92.1 kg)   SpO2 98%   BMI 29.13 kg/m   Physical Exam Constitutional:      General: He is not in acute distress. HENT:     Head: Normocephalic and atraumatic.     Right Ear: Tympanic membrane and external ear normal.     Left Ear: Tympanic membrane and external ear normal.  Eyes:     General: No scleral icterus. Neck:     Thyroid: No thyromegaly.  Cardiovascular:     Rate and Rhythm: Normal rate and regular  rhythm.     Heart sounds: Normal heart sounds.  Pulmonary:     Effort: Pulmonary effort is normal.     Breath sounds: Normal breath sounds.  Abdominal:     General: Bowel sounds are normal. There is no distension.     Palpations: Abdomen is soft.     Tenderness: There is no abdominal tenderness. There is no guarding.  Musculoskeletal:     Cervical back: Normal range of motion.  Lymphadenopathy:     Cervical: No cervical adenopathy.  Skin:    General: Skin is warm and dry.     Findings: No rash.  Neurological:     Mental Status: He is alert and oriented to person, place, and time.     Cranial Nerves: No cranial nerve deficit.     Motor: No abnormal muscle tone.  Psychiatric:        Mood and Affect: Mood normal.        Behavior: Behavior normal.     ------------------------------------------------------------------------------------------------------------------------------------------------------------------------------------------------------------------- Assessment and Plan  Well adult exam Well adult Orders Placed This Encounter  Procedures   COMPLETE METABOLIC PANEL WITH GFR   CBC with Differential   Lipid Panel w/reflex Direct LDL   TSH  Screening: Per lab orders Immunizations:  UTD Anticipatory guidance/Risk factor reduction: Recommendations per AVS.    No orders of the defined types were placed in this encounter.   No follow-ups on file.    This visit occurred during the SARS-CoV-2 public health emergency.  Safety protocols were in place, including screening questions prior to the visit, additional usage of staff PPE, and extensive cleaning of exam room while observing appropriate contact time as indicated for disinfecting solutions.

## 2023-05-01 NOTE — Assessment & Plan Note (Signed)
Well adult Orders Placed This Encounter  Procedures  . COMPLETE METABOLIC PANEL WITH GFR  . CBC with Differential  . Lipid Panel w/reflex Direct LDL  . TSH  Screening: Per lab orders Immunizations:  UTD Anticipatory guidance/Risk factor reduction:  Recommendations per AVS.  

## 2023-05-01 NOTE — Patient Instructions (Signed)
Preventive Care 21-39 Years Old, Male Preventive care refers to lifestyle choices and visits with your health care provider that can promote health and wellness. Preventive care visits are also called wellness exams. What can I expect for my preventive care visit? Counseling During your preventive care visit, your health care provider may ask about your: Medical history, including: Past medical problems. Family medical history. Current health, including: Emotional well-being. Home life and relationship well-being. Sexual activity. Lifestyle, including: Alcohol, nicotine or tobacco, and drug use. Access to firearms. Diet, exercise, and sleep habits. Safety issues such as seatbelt and bike helmet use. Sunscreen use. Work and work environment. Physical exam Your health care provider may check your: Height and weight. These may be used to calculate your BMI (body mass index). BMI is a measurement that tells if you are at a healthy weight. Waist circumference. This measures the distance around your waistline. This measurement also tells if you are at a healthy weight and may help predict your risk of certain diseases, such as type 2 diabetes and high blood pressure. Heart rate and blood pressure. Body temperature. Skin for abnormal spots. What immunizations do I need?  Vaccines are usually given at various ages, according to a schedule. Your health care provider will recommend vaccines for you based on your age, medical history, and lifestyle or other factors, such as travel or where you work. What tests do I need? Screening Your health care provider may recommend screening tests for certain conditions. This may include: Lipid and cholesterol levels. Diabetes screening. This is done by checking your blood sugar (glucose) after you have not eaten for a while (fasting). Hepatitis B test. Hepatitis C test. HIV (human immunodeficiency virus) test. STI (sexually transmitted infection)  testing, if you are at risk. Talk with your health care provider about your test results, treatment options, and if necessary, the need for more tests. Follow these instructions at home: Eating and drinking  Eat a healthy diet that includes fresh fruits and vegetables, whole grains, lean protein, and low-fat dairy products. Drink enough fluid to keep your urine pale yellow. Take vitamin and mineral supplements as recommended by your health care provider. Do not drink alcohol if your health care provider tells you not to drink. If you drink alcohol: Limit how much you have to 0-2 drinks a day. Know how much alcohol is in your drink. In the [place] one drink equals one 12 oz bottle of beer (355 mL), one 5 oz glass of wine (148 mL), or one 1 oz glass of hard liquor (44 mL). Lifestyle Brush your teeth every morning and night with fluoride toothpaste. Floss one time each day. Exercise for at least 30 minutes 5 or more days each week. Do not use any products that contain nicotine or tobacco. These products include cigarettes, chewing tobacco, and vaping devices, such as e-cigarettes. If you need help quitting, ask your health care provider. Do not use drugs. If you are sexually active, practice safe sex. Use a condom or other form of protection to prevent STIs. Find healthy ways to manage stress, such as: Meditation, yoga, or listening to music. Journaling. Talking to a trusted person. Spending time with friends and family. Minimize exposure to UV radiation to reduce your risk of skin cancer. Safety Always wear your seat belt while driving or riding in a vehicle. Do not drive: If you have been drinking alcohol. Do not ride with someone who has been drinking. If you have been using any mind-altering substances   or drugs. While texting. When you are tired or distracted. Wear a helmet and other protective equipment during sports activities. If you have firearms in your house, make sure you  follow all gun safety procedures. Seek help if you have been physically or sexually abused. What's next? Go to your health care provider once a year for an annual wellness visit. Ask your health care provider how often you should have your eyes and teeth checked. Stay up to date on all vaccines. This information is not intended to replace advice given to you by your health care provider. Make sure you discuss any questions you have with your health care provider. Document Revised: 05/26/2021 Document Reviewed: 05/26/2021 Elsevier Patient Education  2023 Elsevier Inc.  

## 2023-05-02 LAB — LIPID PANEL W/REFLEX DIRECT LDL
Cholesterol: 264 mg/dL — ABNORMAL HIGH (ref <200)
HDL: 55 mg/dL (ref >=40)
LDL Cholesterol (Calc): 170 mg/dL (calc) — ABNORMAL HIGH
Non-HDL Cholesterol (Calc): 209 mg/dL (calc) — ABNORMAL HIGH (ref <130)
Total CHOL/HDL Ratio: 4.8 (calc) (ref <5.0)
Triglycerides: 216 mg/dL — ABNORMAL HIGH (ref <150)

## 2023-05-02 LAB — CBC WITH DIFFERENTIAL/PLATELET
Absolute Monocytes: 512 cells/uL (ref 200–950)
Basophils Absolute: 51 cells/uL (ref 0–200)
Basophils Relative: 0.8 %
Eosinophils Absolute: 102 cells/uL (ref 15–500)
Eosinophils Relative: 1.6 %
HCT: 48.1 % (ref 38.5–50.0)
Hemoglobin: 15.9 g/dL (ref 13.2–17.1)
Lymphs Abs: 2176 cells/uL (ref 850–3900)
MCH: 30.2 pg (ref 27.0–33.0)
MCHC: 33.1 g/dL (ref 32.0–36.0)
MCV: 91.3 fL (ref 80.0–100.0)
MPV: 9 fL (ref 7.5–12.5)
Monocytes Relative: 8 %
Neutro Abs: 3558 cells/uL (ref 1500–7800)
Neutrophils Relative %: 55.6 %
Platelets: 308 10*3/uL (ref 140–400)
RBC: 5.27 10*6/uL (ref 4.20–5.80)
RDW: 13.5 % (ref 11.0–15.0)
Total Lymphocyte: 34 %
WBC: 6.4 10*3/uL (ref 3.8–10.8)

## 2023-05-02 LAB — COMPLETE METABOLIC PANEL WITH GFR
AG Ratio: 2.4 (calc) (ref 1.0–2.5)
ALT: 29 U/L (ref 9–46)
AST: 17 U/L (ref 10–40)
Albumin: 5 g/dL (ref 3.6–5.1)
Alkaline phosphatase (APISO): 75 U/L (ref 36–130)
BUN: 15 mg/dL (ref 7–25)
CO2: 29 mmol/L (ref 20–32)
Calcium: 10.5 mg/dL — ABNORMAL HIGH (ref 8.6–10.3)
Chloride: 100 mmol/L (ref 98–110)
Creat: 1.03 mg/dL (ref 0.60–1.26)
Globulin: 2.1 g/dL (calc) (ref 1.9–3.7)
Glucose, Bld: 87 mg/dL (ref 65–99)
Potassium: 4.3 mmol/L (ref 3.5–5.3)
Sodium: 139 mmol/L (ref 135–146)
Total Bilirubin: 0.5 mg/dL (ref 0.2–1.2)
Total Protein: 7.1 g/dL (ref 6.1–8.1)
eGFR: 95 mL/min/{1.73_m2} (ref >=60)

## 2023-05-02 LAB — TSH: TSH: 1.18 mIU/L (ref 0.40–4.50)

## 2023-05-12 ENCOUNTER — Encounter: Payer: Self-pay | Admitting: Family Medicine

## 2023-07-06 ENCOUNTER — Other Ambulatory Visit: Payer: BC Managed Care – PPO

## 2023-07-06 DIAGNOSIS — Z302 Encounter for sterilization: Secondary | ICD-10-CM

## 2023-10-18 ENCOUNTER — Other Ambulatory Visit: Payer: Self-pay | Admitting: Family Medicine

## 2023-10-18 DIAGNOSIS — I10 Essential (primary) hypertension: Secondary | ICD-10-CM

## 2023-11-02 ENCOUNTER — Encounter: Payer: Self-pay | Admitting: Family Medicine

## 2023-11-02 ENCOUNTER — Ambulatory Visit: Payer: BC Managed Care – PPO | Admitting: Family Medicine

## 2023-11-02 VITALS — BP 111/71 | HR 80 | Ht 70.0 in | Wt 205.0 lb

## 2023-11-02 DIAGNOSIS — I10 Essential (primary) hypertension: Secondary | ICD-10-CM | POA: Diagnosis not present

## 2023-11-02 MED ORDER — LISINOPRIL-HYDROCHLOROTHIAZIDE 20-25 MG PO TABS: 1.0000 | ORAL_TABLET | Freq: Every day | ORAL | 1 refills | Status: DC

## 2023-11-02 NOTE — Patient Instructions (Signed)
Hold amlodipine for now.  If BP starts to creep up we'll plan to add back on at 5mg .

## 2023-11-02 NOTE — Assessment & Plan Note (Addendum)
BP has been dipping a little low.  Will hold amlodipine and have him monitor BP at home.  If BP becomes high again will add amlodipine back on at 5mg .

## 2023-11-02 NOTE — Progress Notes (Signed)
Lucas Rodriguez - 42 y.o. male MRN [id number]  Date of birth: 18-Mar-1981  Subjective Chief Complaint  Patient presents with   Hypertension    HPI Lucas Rodriguez is a 42 y.o. male here today for follow up visit.   Reports that he is doing pretty well.   He continues on lisinopril/hydrochlorothiazide and amlodipine for management of HTN.  Overall he is tolerating this well.  Checking BP at home with readings of around 100/60-70.  Denies dizziness.  He has not had chest pain, shortness of breath, palpitations, headache or vision changes.    ROS:  A comprehensive ROS was completed and negative except as noted per HPI  Allergies  Allergen Reactions   Penicillin G Rash    Also itching    Past Medical History:  Diagnosis Date   Hypertension     History reviewed. No pertinent surgical history.  Social History   Socioeconomic History   Marital status: Married    Spouse name: Not on file   Number of children: Not on file   Years of education: Not on file   Highest education level: Master's degree (e.g., MA, MS, MEng, MEd, MSW, MBA)  Occupational History   Not on file  Tobacco Use   Smoking status: Never   Smokeless tobacco: Former    Types: Snuff  Substance and Sexual Activity   Alcohol use: No    Comment: Prior developing problem. Now abstinent   Drug use: No   Sexual activity: Yes  Other Topics Concern   Not on file  Social History Narrative   Not on file   Social Determinants of Health   Financial Resource Strain: Low Risk  (10/30/2023)   Overall Financial Resource Strain (CARDIA)    Difficulty of Paying Living Expenses: Not hard at all  Food Insecurity: No Food Insecurity (10/30/2023)   Hunger Vital Sign    Worried About Running Out of Food in the Last Year: Never true    Ran Out of Food in the Last Year: Never true  Transportation Needs: No Transportation Needs (10/30/2023)   PRAPARE - Administrator, Civil Service (Medical): No    Lack of  Transportation (Non-Medical): No  Physical Activity: Insufficiently Active (10/30/2023)   Exercise Vital Sign    Days of Exercise per Week: 3 days    Minutes of Exercise per Session: 30 min  Stress: Stress Concern Present (10/30/2023)   [employer] of Occupational Health - Occupational Stress Questionnaire    Feeling of Stress : To some extent  Social Connections: Moderately Integrated (10/30/2023)   Social Connection and Isolation Panel [NHANES]    Frequency of Communication with Friends and Family: Once a week    Frequency of Social Gatherings with Friends and Family: Once a week    Attends Religious Services: More than 4 times per year    Active Member of [employer] or Organizations: Yes    Attends Engineer, structural: More than 4 times per year    Marital Status: Married    Family History  Problem Relation Age of Onset   Hyperlipidemia Mother    Hypertension Mother    Hyperlipidemia Father    Hypertension Father    Alcohol abuse Maternal Grandmother    Cancer Maternal Grandmother    Alcohol abuse Maternal Grandfather    Cancer Maternal Grandfather    Hyperlipidemia Maternal Grandfather    Hypertension Maternal Grandfather    Alcohol abuse Paternal Grandmother    Cancer Paternal Grandmother  Hyperlipidemia Paternal Grandmother    Hypertension Paternal Grandmother    Alcohol abuse Paternal Grandfather    Cancer Paternal Grandfather     Health Maintenance  Topic Date Due   Hepatitis C Screening  Never done   DTaP/Tdap/Td (3 - Td or Tdap) 09/22/2030   INFLUENZA VACCINE  Completed   COVID-19 Vaccine  Completed   HIV Screening  Completed   HPV VACCINES  Aged Out     ----------------------------------------------------------------------------------------------------------------------------------------------------------------------------------------------------------------- Physical Exam BP 111/71 (BP Location: Left Arm, Patient Position: Sitting, Cuff  Size: Normal)   Pulse 80   Ht 5\' 10"  (1.778 m)   Wt 205 lb (93 kg)   SpO2 99%   BMI 29.41 kg/m   Physical Exam Constitutional:      Appearance: Normal appearance.  Cardiovascular:     Rate and Rhythm: Normal rate and regular rhythm.  Pulmonary:     Effort: Pulmonary effort is normal.     Breath sounds: Normal breath sounds.  Neurological:     General: No focal deficit present.     Mental Status: He is alert.  Psychiatric:        Mood and Affect: Mood normal.        Behavior: Behavior normal.     ------------------------------------------------------------------------------------------------------------------------------------------------------------------------------------------------------------------- Assessment and Plan  HTN (hypertension) BP has been dipping a little low.  Will hold amlodipine and have him monitor BP at home.  If BP becomes high again will add amlodipine back on at 5mg .     Meds ordered this encounter  Medications   lisinopril-hydrochlorothiazide (ZESTORETIC) 20-25 MG tablet    Sig: Take 1 tablet by mouth daily.    Dispense:  90 tablet    Refill:  1    Return in about 6 months (around 05/01/2024) for Annual Exam/Fasting labs.    This visit occurred during the SARS-CoV-2 public health emergency.  Safety protocols were in place, including screening questions prior to the visit, additional usage of staff PPE, and extensive cleaning of exam room while observing appropriate contact time as indicated for disinfecting solutions.

## 2023-11-19 IMAGING — DX DG HIP (WITH OR WITHOUT PELVIS) 3-4V BILAT
5 series · 5 of 5 positions shown · non-contrast
Comparison: None Available.

CLINICAL DATA: Left worse than right hip and groin pain. Evaluate
for signs of osteoarthritis versus avascular necrosis.

EXAM:
DG HIP (WITH OR WITHOUT PELVIS) 3-4V BILAT

[pelvis ap]
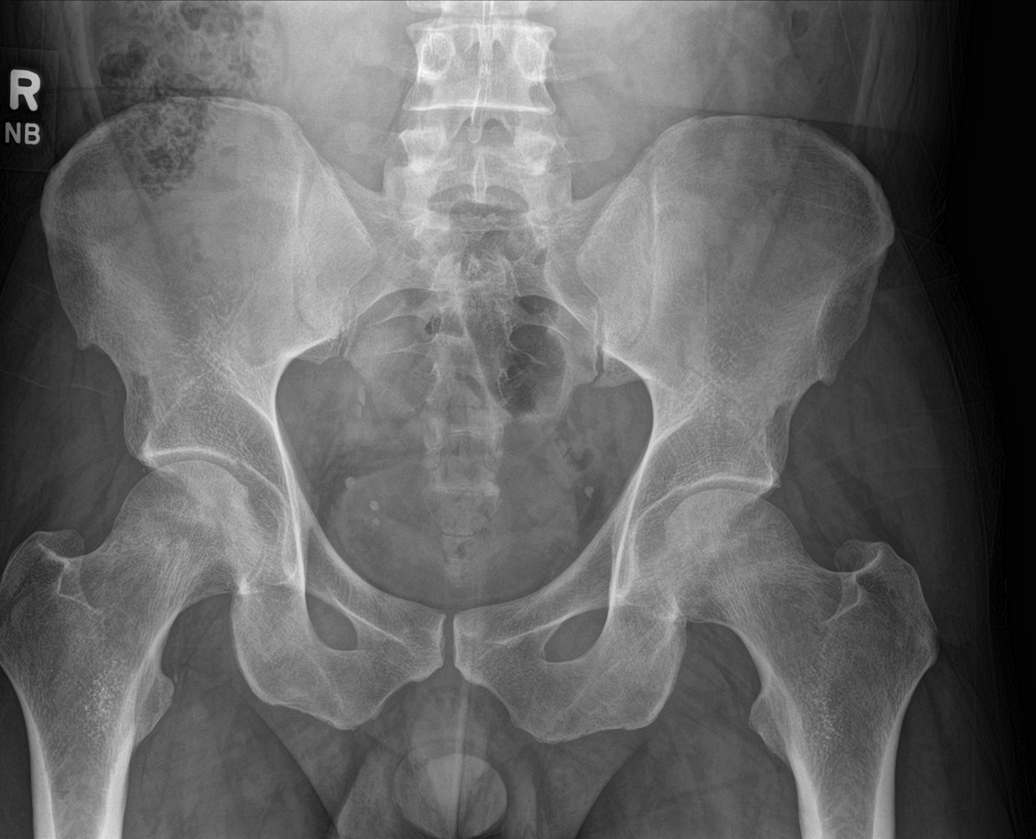

[hip ap (1 of 2)]
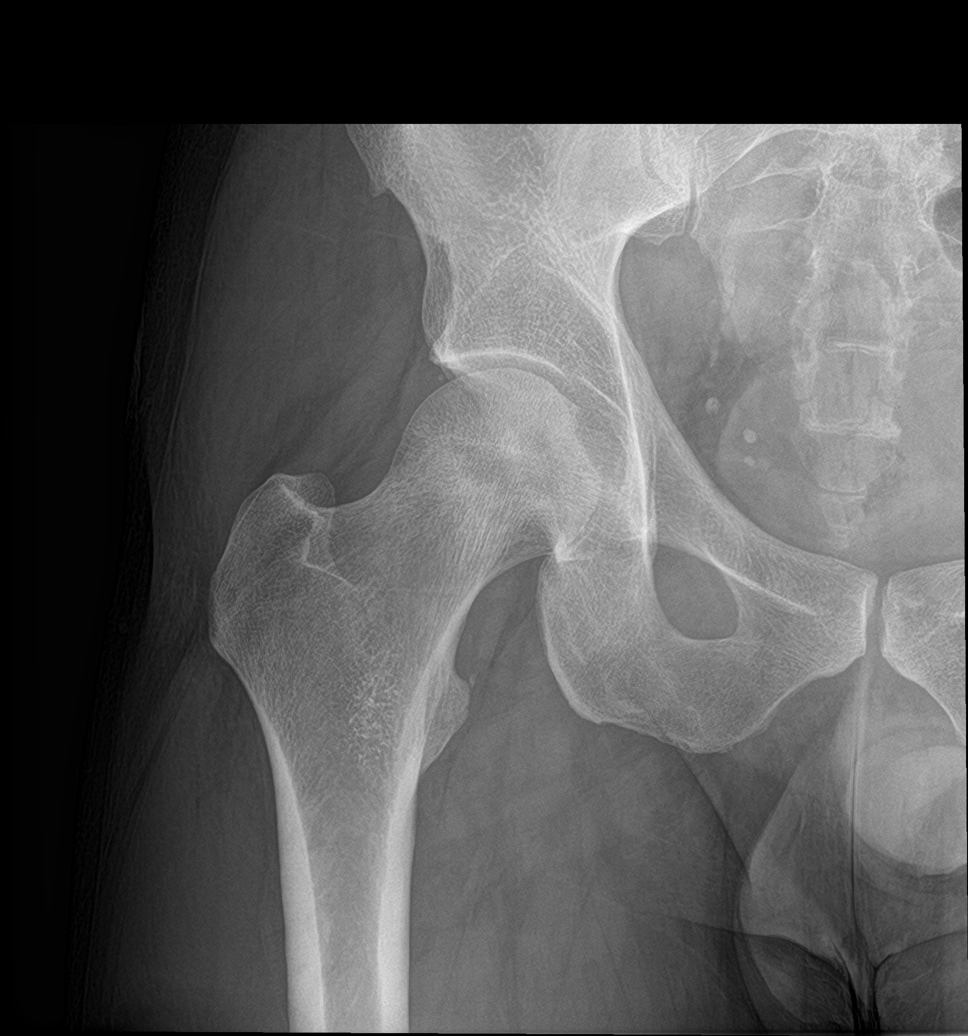

[hip lat (1 of 2)]
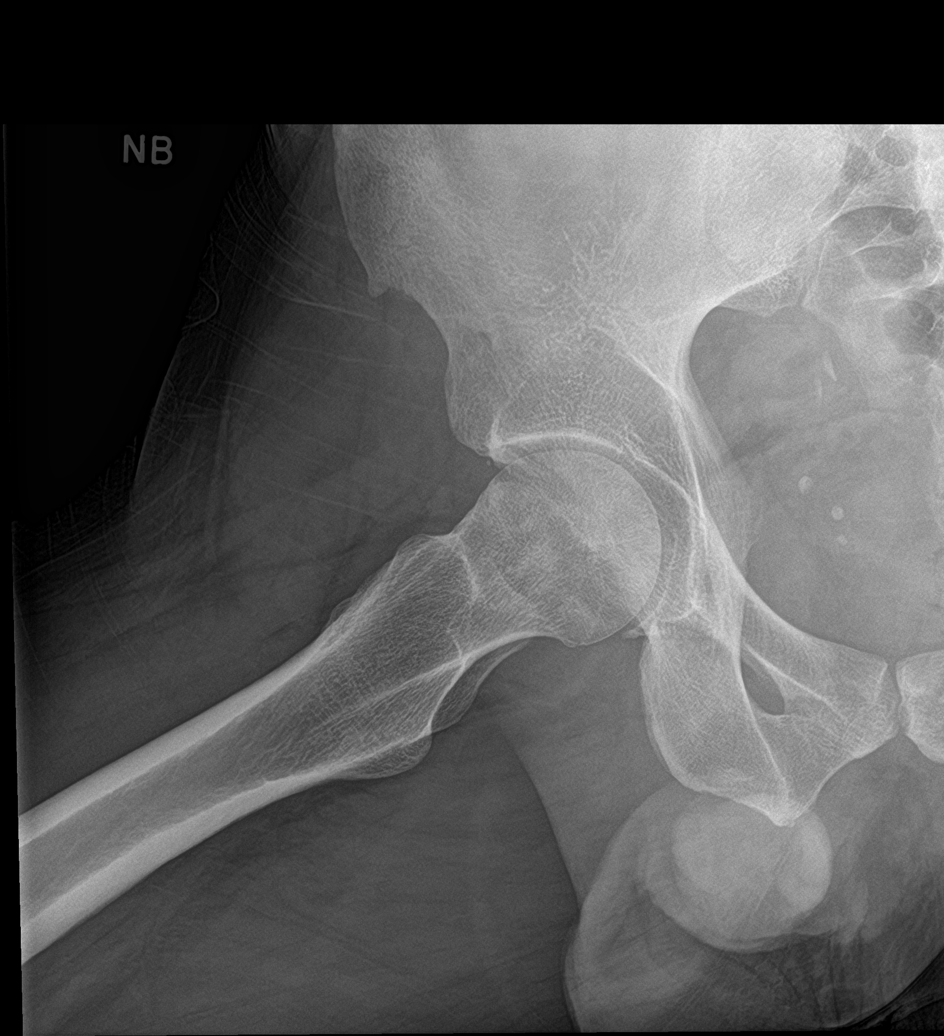

[hip lat (2 of 2)]
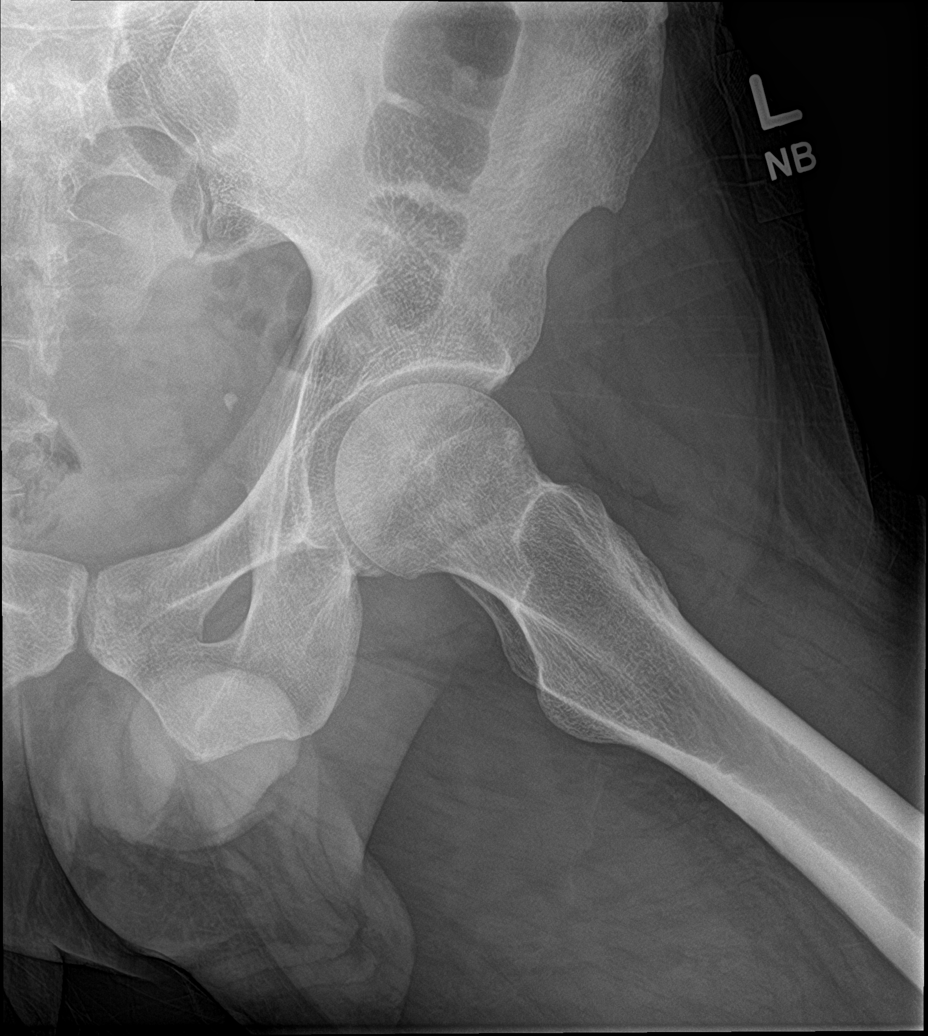

[hip ap (2 of 2)]
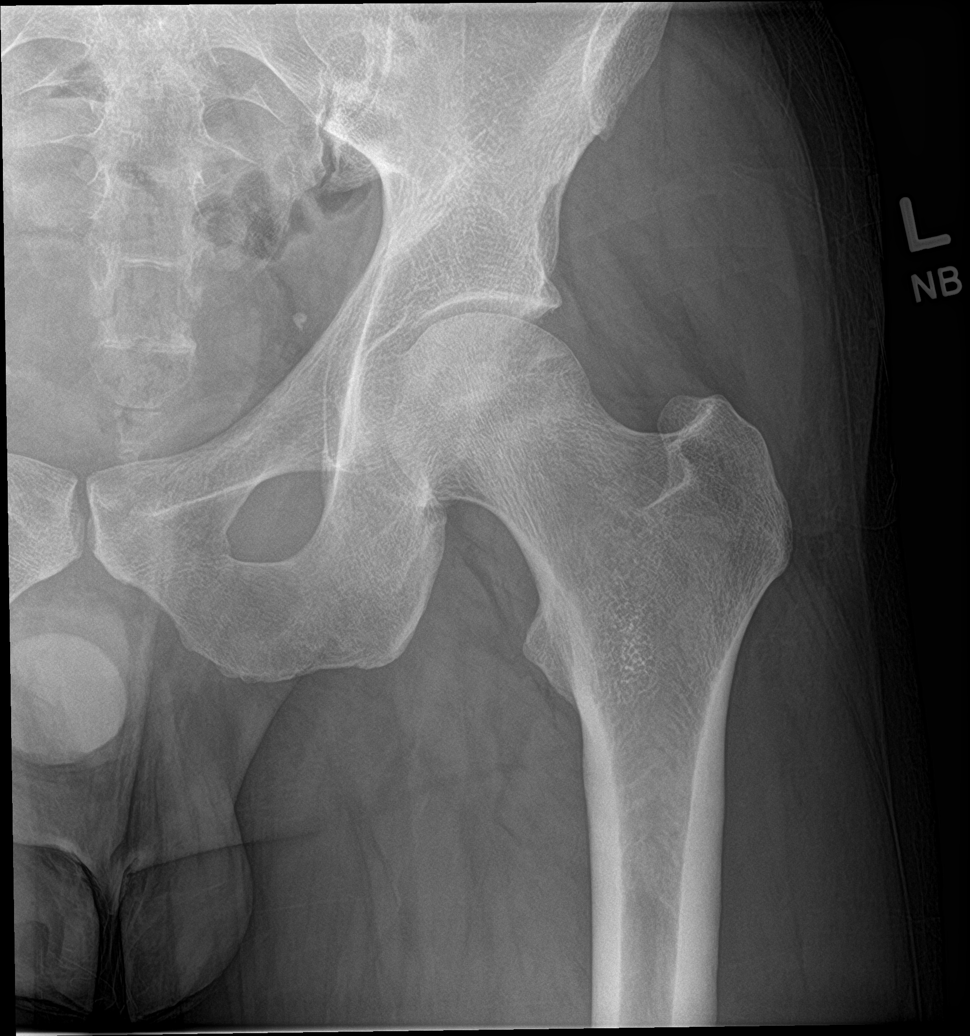

[5 of 5 positions shown; findings below may reference images not displayed]

FINDINGS: The bilateral sacroiliac and pubic symphysis joint spaces are
maintained. The bilateral femoroacetabular joint spaces are
maintained.

Mild patchy sclerosis is seen within the bilateral femoral heads.
There appears to be minimal superior left femoral head cortical
flattening with less than 1 mm superolateral cortical step-off.
Possible subtle curvilinear lucent subchondral crescent line within
the superior left femoral head.

No acute fracture or dislocation.
IMPRESSION: 1. Mild patchy sclerosis within the superior left femoral head with
minimal likely early superior left femoral head less than 1 mm
cortical flattening/step-off. This is suspicious for avascular
necrosis.
2. Patchy sclerosis within the right femoral head suspicious for
early avascular necrosis.

## 2024-04-15 ENCOUNTER — Other Ambulatory Visit: Payer: Self-pay | Admitting: Family Medicine

## 2024-04-15 DIAGNOSIS — I10 Essential (primary) hypertension: Secondary | ICD-10-CM

## 2024-04-15 NOTE — Telephone Encounter (Signed)
 Hold for 05/01/24 appt

## 2024-05-01 ENCOUNTER — Encounter: Payer: BC Managed Care – PPO | Admitting: Family Medicine

## 2024-05-08 ENCOUNTER — Encounter: Admitting: Family Medicine

## 2024-06-10 DIAGNOSIS — H35712 Central serous chorioretinopathy, left eye: Secondary | ICD-10-CM | POA: Diagnosis not present

## 2024-06-21 ENCOUNTER — Other Ambulatory Visit: Payer: Self-pay | Admitting: Family Medicine

## 2024-06-21 DIAGNOSIS — I10 Essential (primary) hypertension: Secondary | ICD-10-CM

## 2024-06-21 NOTE — Telephone Encounter (Signed)
 Called left message to call back and schedule 6 month f/u for HTN Cancelled appt with Dr. Alvan

## 2024-06-21 NOTE — Telephone Encounter (Signed)
 Pls contact the pt to schedule past due 6-mth HTN follow-up with Dr. Alvia.  Sending 30 day refill.   Pls check 11/06/24 appt scheduled with Dr. Alvan. He has never seen Dr. Alvan. Thx.

## 2024-07-12 ENCOUNTER — Ambulatory Visit: Admitting: Family Medicine

## 2024-07-17 DIAGNOSIS — H35712 Central serous chorioretinopathy, left eye: Secondary | ICD-10-CM | POA: Diagnosis not present

## 2024-07-17 DIAGNOSIS — H40023 Open angle with borderline findings, high risk, bilateral: Secondary | ICD-10-CM | POA: Diagnosis not present

## 2024-07-25 ENCOUNTER — Ambulatory Visit: Admitting: Family Medicine

## 2024-07-25 ENCOUNTER — Encounter: Payer: Self-pay | Admitting: Family Medicine

## 2024-07-25 VITALS — BP 118/77 | HR 81 | Resp 20 | Ht 70.0 in | Wt 210.0 lb

## 2024-07-25 DIAGNOSIS — I10 Essential (primary) hypertension: Secondary | ICD-10-CM | POA: Diagnosis not present

## 2024-07-25 DIAGNOSIS — R6882 Decreased libido: Secondary | ICD-10-CM

## 2024-07-25 DIAGNOSIS — N529 Male erectile dysfunction, unspecified: Secondary | ICD-10-CM | POA: Diagnosis not present

## 2024-07-25 DIAGNOSIS — E785 Hyperlipidemia, unspecified: Secondary | ICD-10-CM | POA: Diagnosis not present

## 2024-07-25 MED ORDER — SILDENAFIL CITRATE 100 MG PO TABS: 50.0000 mg | ORAL_TABLET | ORAL | 1 refills | Status: AC | PRN

## 2024-07-25 MED ORDER — LISINOPRIL-HYDROCHLOROTHIAZIDE 20-25 MG PO TABS
1.0000 | ORAL_TABLET | Freq: Every day | ORAL | 3 refills | Status: DC
Start: 2024-07-25 — End: 2024-07-25

## 2024-07-25 MED ORDER — LISINOPRIL-HYDROCHLOROTHIAZIDE 20-25 MG PO TABS: 1.0000 | ORAL_TABLET | Freq: Every day | ORAL | 3 refills | Status: AC

## 2024-07-25 NOTE — Assessment & Plan Note (Signed)
 Trial sildenafil.  Discussed potential adverse effects and red flags.  Also check testosterone levels due to his reduced libido.

## 2024-07-25 NOTE — Progress Notes (Signed)
 Lucas Rodriguez - 43 y.o. male MRN [id number]  Date of birth: 1981-04-23  Subjective Chief Complaint  Patient presents with   Medication Refill    bp med    HPI Lucas Rodriguez is a 43 y.o. male here today for follow up visit.    Continues on lisinopril /hydrochlorothiazide  for management of hypertension.  Blood pressure is well-controlled.  Denies side effects of current strength.  He has not had chest pain, shortness of breath, palpitations, headaches or vision changes.    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  Has noted some ED symptoms and decreased libido which she thinks may be stress related but is not sure.  ROS:  A comprehensive ROS was completed and negative except as noted per HPI    Allergies  Allergen Reactions   Penicillin G Rash    Also itching    Past Medical History:  Diagnosis Date   Hypertension     History reviewed. No pertinent surgical history.  Social History   Socioeconomic History   Marital status: Married    Spouse name: Not on file   Number of children: Not on file   Years of education: Not on file   Highest education level: Master's degree (e.g., MA, MS, MEng, MEd, MSW, MBA)  Occupational History   Not on file  Tobacco Use   Smoking status: Never   Smokeless tobacco: Former    Types: Snuff  Substance and Sexual Activity   Alcohol use: No    Comment: Prior developing problem. Now abstinent   Drug use: No   Sexual activity: Yes  Other Topics Concern   Not on file  Social History Narrative   Not on file   Social Drivers of Health   Financial Resource Strain: Low Risk  (07/24/2024)   Overall Financial Resource Strain (CARDIA)    Difficulty of Paying Living Expenses: Not hard at all  Food Insecurity: No Food Insecurity (07/24/2024)   Hunger Vital Sign    Worried About Running Out of Food in the Last Year: Never true    Ran Out of Food in the Last Year: Never true   Transportation Needs: No Transportation Needs (07/24/2024)   PRAPARE - Administrator, Civil Service (Medical): No    Lack of Transportation (Non-Medical): No  Physical Activity: Insufficiently Active (07/24/2024)   Exercise Vital Sign    Days of Exercise per Week: 2 days    Minutes of Exercise per Session: 30 min  Stress: Stress Concern Present (07/24/2024)   [employer] of Occupational Health - Occupational Stress Questionnaire    Feeling of Stress: Very much  Social Connections: Moderately Integrated (07/24/2024)   Social Connection and Isolation Panel    Frequency of Communication with Friends and Family: Once a week    Frequency of Social Gatherings with Friends and Family: Once a week    Attends Religious Services: More than 4 times per year    Active Member of [employer] or Organizations: Yes    Attends Engineer, structural: More than 4 times per year    Marital Status: Married    Family History  Problem Relation Age of Onset   Hyperlipidemia Mother    Hypertension Mother    Hyperlipidemia Father    Hypertension Father    Alcohol abuse Maternal Grandmother    Cancer Maternal Grandmother    Alcohol abuse Maternal Grandfather    Cancer Maternal Grandfather  Hyperlipidemia Maternal Grandfather    Hypertension Maternal Grandfather    Alcohol abuse Paternal Grandmother    Cancer Paternal Grandmother    Hyperlipidemia Paternal Grandmother    Hypertension Paternal Grandmother    Alcohol abuse Paternal Grandfather    Cancer Paternal Grandfather     Health Maintenance  Topic Date Due   Hepatitis C Screening  Never done   Hepatitis B Vaccines 19-59 Average Risk (1 of 3 - 19+ 3-dose series) Never done   HPV VACCINES (1 - 3-dose SCDM series) Never done   COVID-19 Vaccine (2 - Pfizer risk series) 10/07/2023   INFLUENZA VACCINE  07/12/2024   DTaP/Tdap/Td (3 - Td or Tdap) 09/22/2030   HIV Screening  Completed   Pneumococcal Vaccine  Aged Out    Meningococcal B Vaccine  Aged Out     ----------------------------------------------------------------------------------------------------------------------------------------------------------------------------------------------------------------- Physical Exam BP 118/77 (BP Location: Left Arm, Patient Position: Sitting, Cuff Size: Normal)   Pulse 81   Resp 20   Ht 5' 10 (1.778 m)   Wt 210 lb (95.3 kg)   SpO2 98%   BMI 30.13 kg/m   Physical Exam Constitutional:      Appearance: Normal appearance.  Eyes:     General: No scleral icterus. Cardiovascular:     Rate and Rhythm: Normal rate and regular rhythm.  Pulmonary:     Effort: Pulmonary effort is normal.     Breath sounds: Normal breath sounds.  Neurological:     Mental Status: He is alert.  Psychiatric:        Mood and Affect: Mood normal.        Behavior: Behavior normal.     ------------------------------------------------------------------------------------------------------------------------------------------------------------------------------------------------------------------- Assessment and Plan  HTN (hypertension) Blood pressure is well-controlled.  Continue lisinopril /hydrochlorothiazide  at current strength.  Updated labs ordered.  Hyperlipidemia Update lipid panel.   Erectile dysfunction Trial sildenafil .  Discussed potential adverse effects and red flags.  Also check testosterone  levels due to his reduced libido.   Meds ordered this encounter  Medications   sildenafil  (VIAGRA ) 100 MG tablet    Sig: Take 0.5-1 tablets (50-100 mg total) by mouth as needed for erectile dysfunction (for use prior to sexual activity).    Dispense:  20 tablet    Refill:  1   DISCONTD: lisinopril -hydrochlorothiazide  (ZESTORETIC ) 20-25 MG tablet    Sig: Take 1 tablet by mouth daily.    Dispense:  90 tablet    Refill:  3   lisinopril -hydrochlorothiazide  (ZESTORETIC ) 20-25 MG tablet    Sig: Take 1 tablet by mouth daily.     Dispense:  90 tablet    Refill:  3    No follow-ups on file.

## 2024-07-25 NOTE — Assessment & Plan Note (Signed)
 Blood pressure is well-controlled.  Continue lisinopril /hydrochlorothiazide  at current strength.  Updated labs ordered.

## 2024-07-25 NOTE — Assessment & Plan Note (Signed)
 Update lipid panel.

## 2024-07-26 LAB — CBC WITH DIFFERENTIAL/PLATELET
Basophils Absolute: 0.1 x10E3/uL (ref 0.0–0.2)
Basos: 1 %
EOS (ABSOLUTE): 0.1 x10E3/uL (ref 0.0–0.4)
Eos: 1 %
Hematocrit: 50.7 % (ref 37.5–51.0)
Hemoglobin: 17.3 g/dL (ref 13.0–17.7)
Immature Grans (Abs): 0 x10E3/uL (ref 0.0–0.1)
Immature Granulocytes: 0 %
Lymphocytes Absolute: 1.7 x10E3/uL (ref 0.7–3.1)
Lymphs: 27 %
MCH: 33 pg (ref 26.6–33.0)
MCHC: 34.1 g/dL (ref 31.5–35.7)
MCV: 97 fL (ref 79–97)
Monocytes Absolute: 0.6 x10E3/uL (ref 0.1–0.9)
Monocytes: 10 %
Neutrophils Absolute: 3.9 x10E3/uL (ref 1.4–7.0)
Neutrophils: 60 %
Platelets: 252 x10E3/uL (ref 150–450)
RBC: 5.25 x10E6/uL (ref 4.14–5.80)
RDW: 12.7 % (ref 11.6–15.4)
WBC: 6.3 x10E3/uL (ref 3.4–10.8)

## 2024-07-26 LAB — CMP14+EGFR
ALT: 76 IU/L — ABNORMAL HIGH (ref 0–44)
AST: 52 IU/L — ABNORMAL HIGH (ref 0–40)
Albumin: 4.8 g/dL (ref 4.1–5.1)
Alkaline Phosphatase: 96 IU/L (ref 44–121)
BUN/Creatinine Ratio: 10 (ref 9–20)
BUN: 10 mg/dL (ref 6–24)
Bilirubin Total: 1 mg/dL (ref 0.0–1.2)
CO2: 19 mmol/L — ABNORMAL LOW (ref 20–29)
Calcium: 9.8 mg/dL (ref 8.7–10.2)
Chloride: 95 mmol/L — ABNORMAL LOW (ref 96–106)
Creatinine, Ser: 0.96 mg/dL (ref 0.76–1.27)
Globulin, Total: 2.1 g/dL (ref 1.5–4.5)
Glucose: 94 mg/dL (ref 70–99)
Potassium: 3.9 mmol/L (ref 3.5–5.2)
Sodium: 134 mmol/L (ref 134–144)
Total Protein: 6.9 g/dL (ref 6.0–8.5)
eGFR: 102 mL/min/1.73 (ref >59)

## 2024-07-26 LAB — LIPID PANEL WITH LDL/HDL RATIO
Cholesterol, Total: 266 mg/dL — ABNORMAL HIGH (ref 100–199)
HDL: 62 mg/dL (ref >39)
LDL Chol Calc (NIH): 165 mg/dL — ABNORMAL HIGH (ref 0–99)
LDL/HDL Ratio: 2.7 ratio (ref 0.0–3.6)
Triglycerides: 213 mg/dL — ABNORMAL HIGH (ref 0–149)
VLDL Cholesterol Cal: 39 mg/dL (ref 5–40)

## 2024-07-26 LAB — TESTOSTERONE: Testosterone: 484 ng/dL (ref 264–916)

## 2024-08-09 ENCOUNTER — Ambulatory Visit: Payer: Self-pay | Admitting: Family Medicine

## 2024-08-13 ENCOUNTER — Encounter: Payer: Self-pay | Admitting: Sports Medicine

## 2024-10-14 ENCOUNTER — Other Ambulatory Visit: Payer: Self-pay | Admitting: Family Medicine

## 2024-10-14 DIAGNOSIS — I10 Essential (primary) hypertension: Secondary | ICD-10-CM

## 2024-11-06 ENCOUNTER — Ambulatory Visit: Admitting: Family Medicine
